# Patient Record
Sex: Male | Born: 1965 | Race: White | Hispanic: No | Marital: Married | Smoking: Current every day smoker
Health system: Southern US, Community
[De-identification: ages and names within clinical notes are randomized; demographics above are authoritative.]

## PROBLEM LIST (undated history)

## (undated) DIAGNOSIS — E7800 Pure hypercholesterolemia, unspecified: Secondary | ICD-10-CM

## (undated) DIAGNOSIS — F419 Anxiety disorder, unspecified: Secondary | ICD-10-CM

## (undated) DIAGNOSIS — I10 Essential (primary) hypertension: Secondary | ICD-10-CM

---

## 2006-05-27 ENCOUNTER — Ambulatory Visit: Payer: Self-pay | Admitting: Internal Medicine

## 2010-08-18 ENCOUNTER — Emergency Department: Payer: Self-pay | Admitting: Emergency Medicine

## 2014-12-16 DIAGNOSIS — E7800 Pure hypercholesterolemia, unspecified: Secondary | ICD-10-CM | POA: Insufficient documentation

## 2014-12-16 DIAGNOSIS — F419 Anxiety disorder, unspecified: Secondary | ICD-10-CM | POA: Insufficient documentation

## 2015-05-01 DIAGNOSIS — I10 Essential (primary) hypertension: Secondary | ICD-10-CM | POA: Insufficient documentation

## 2017-05-23 ENCOUNTER — Emergency Department (HOSPITAL_COMMUNITY)
Admission: EM | Admit: 2017-05-23 | Discharge: 2017-05-23 | Disposition: A | Discharge status: Home / Self Care | Payer: BLUE CROSS/BLUE SHIELD | Attending: Emergency Medicine | Admitting: Emergency Medicine

## 2017-05-23 ENCOUNTER — Emergency Department (HOSPITAL_COMMUNITY): Payer: BLUE CROSS/BLUE SHIELD

## 2017-05-23 ENCOUNTER — Encounter (HOSPITAL_COMMUNITY): Payer: Self-pay

## 2017-05-23 DIAGNOSIS — Z79899 Other long term (current) drug therapy: Secondary | ICD-10-CM | POA: Insufficient documentation

## 2017-05-23 DIAGNOSIS — R109 Unspecified abdominal pain: Secondary | ICD-10-CM

## 2017-05-23 DIAGNOSIS — R1084 Generalized abdominal pain: Secondary | ICD-10-CM | POA: Diagnosis not present

## 2017-05-23 DIAGNOSIS — I10 Essential (primary) hypertension: Secondary | ICD-10-CM | POA: Insufficient documentation

## 2017-05-23 DIAGNOSIS — F17210 Nicotine dependence, cigarettes, uncomplicated: Secondary | ICD-10-CM | POA: Insufficient documentation

## 2017-05-23 HISTORY — DX: Essential (primary) hypertension: I10

## 2017-05-23 HISTORY — DX: Pure hypercholesterolemia, unspecified: E78.00

## 2017-05-23 HISTORY — DX: Anxiety disorder, unspecified: F41.9

## 2017-05-23 LAB — CBC WITH DIFFERENTIAL/PLATELET
Basophils Absolute: 0 10*3/uL (ref 0.0–0.1)
Basophils Relative: 0 %
EOS ABS: 0 10*3/uL (ref 0.0–0.7)
Eosinophils Relative: 0 %
HCT: 43.1 % (ref 39.0–52.0)
HEMOGLOBIN: 14.7 g/dL (ref 13.0–17.0)
LYMPHS ABS: 0.8 10*3/uL (ref 0.7–4.0)
LYMPHS PCT: 7 %
MCH: 31.9 pg (ref 26.0–34.0)
MCHC: 34.1 g/dL (ref 30.0–36.0)
MCV: 93.5 fL (ref 78.0–100.0)
MONOS PCT: 4 %
Monocytes Absolute: 0.4 10*3/uL (ref 0.1–1.0)
NEUTROS PCT: 89 %
Neutro Abs: 10 10*3/uL — ABNORMAL HIGH (ref 1.7–7.7)
Platelets: 215 10*3/uL (ref 150–400)
RBC: 4.61 MIL/uL (ref 4.22–5.81)
RDW: 12.9 % (ref 11.5–15.5)
WBC: 11.3 10*3/uL — AB (ref 4.0–10.5)

## 2017-05-23 LAB — BASIC METABOLIC PANEL
Anion gap: 8 (ref 5–15)
BUN: 8 mg/dL (ref 6–20)
CHLORIDE: 100 mmol/L — AB (ref 101–111)
CO2: 27 mmol/L (ref 22–32)
CREATININE: 1.05 mg/dL (ref 0.61–1.24)
Calcium: 8.8 mg/dL — ABNORMAL LOW (ref 8.9–10.3)
GFR calc Af Amer: >60 mL/min (ref >60)
GFR calc non Af Amer: >60 mL/min (ref >60)
GLUCOSE: 120 mg/dL — AB (ref 65–99)
POTASSIUM: 3.7 mmol/L (ref 3.5–5.1)
SODIUM: 135 mmol/L (ref 135–145)

## 2017-05-23 LAB — LIPASE, BLOOD: Lipase: 30 U/L (ref 11–51)

## 2017-05-23 LAB — HM COLONOSCOPY

## 2017-05-23 MED ORDER — ONDANSETRON HCL 4 MG/2ML IJ SOLN
4.0000 mg | Freq: Once | INTRAMUSCULAR | Status: AC
  Administered 2017-05-23: 4 mg via INTRAVENOUS
  Filled 2017-05-23: qty 2

## 2017-05-23 MED ORDER — AMOXICILLIN-POT CLAVULANATE 875-125 MG PO TABS: 1.0000 | ORAL_TABLET | Freq: Two times a day (BID) | ORAL | 0 refills | Status: DC

## 2017-05-23 MED ORDER — IOPAMIDOL (ISOVUE-300) INJECTION 61%
INTRAVENOUS | Status: AC
  Filled 2017-05-23: qty 30

## 2017-05-23 MED ORDER — AMOXICILLIN-POT CLAVULANATE 875-125 MG PO TABS
1.0000 | ORAL_TABLET | Freq: Once | ORAL | Status: AC
  Administered 2017-05-23: 1 via ORAL
  Filled 2017-05-23: qty 1

## 2017-05-23 MED ORDER — FENTANYL CITRATE (PF) 100 MCG/2ML IJ SOLN
100.0000 ug | Freq: Once | INTRAMUSCULAR | Status: DC
  Filled 2017-05-23: qty 2

## 2017-05-23 MED ORDER — LACTATED RINGERS IV BOLUS (SEPSIS)
1000.0000 mL | Freq: Once | INTRAVENOUS | Status: AC
Start: 2017-05-23 — End: 2017-05-23
  Administered 2017-05-23: 1000 mL via INTRAVENOUS

## 2017-05-23 MED ORDER — IOPAMIDOL (ISOVUE-300) INJECTION 61%
INTRAVENOUS | Status: AC
  Administered 2017-05-23: 100 mL
  Filled 2017-05-23: qty 100

## 2017-05-23 NOTE — ED Provider Notes (Signed)
[facility]-EMERGENCY DEPT Provider Note   CSN: [id number] Arrival date & time: 05/23/17  1546     History   Chief Complaint Chief Complaint  Patient presents with  . Abdominal Pain    HPI Kevin Buckley is a 51 y.o. male.   Abdominal Pain   This is a new problem. The current episode started 1 to 2 hours ago. The problem occurs constantly. The problem has been gradually worsening. The pain is located in the generalized abdominal region. The quality of the pain is sharp. The pain is moderate. Associated symptoms include flatus (improves it). Nothing aggravates the symptoms. Nothing relieves the symptoms. Past workup does not include GI consult, ultrasound or surgery.    Past Medical History:  Diagnosis Date  . Anxiety   . Elevated cholesterol   . Hypertension     There are no active problems to display for this patient.   History reviewed. No pertinent surgical history.     Home Medications    Prior to Admission medications   Medication Sig Start Date End Date Taking? Authorizing Provider  atorvastatin (LIPITOR) 20 MG tablet Take 20 mg by mouth daily. 04/19/17  Yes [provider]  busPIRone (BUSPAR) 10 MG tablet Take 10 mg by mouth daily. 05/16/17  Yes [provider]  fenofibrate (TRICOR) 145 MG tablet Take 145 mg by mouth daily. 05/03/17  Yes [provider]  losartan-hydrochlorothiazide (HYZAAR) 100-12.5 MG tablet Take 1 tablet by mouth daily. 04/19/17  Yes [provider]  sertraline (ZOLOFT) 100 MG tablet Take 150 mg by mouth daily. 04/19/17  Yes [provider]  amoxicillin-clavulanate (AUGMENTIN) 875-125 MG tablet Take 1 tablet by mouth 2 (two) times daily. One po bid x 7 days 05/23/17   Rhydian Baldi, Barbara Cower, MD    Family History History reviewed. No pertinent family history.  Social History Social History  Substance Use Topics  . Smoking status: Current Every Day Smoker  . Smokeless tobacco: Never Used  . Alcohol use Yes      Allergies   Patient has no known allergies.   Review of Systems Review of Systems  Gastrointestinal: Positive for abdominal pain and flatus (improves it).  All other systems reviewed and are negative.    Physical Exam Updated Vital Signs BP (!) 158/110   Pulse 95   Resp 12   Ht 6' (1.829 m)   Wt 108.9 kg (240 lb)   SpO2 100%   BMI 32.55 kg/m   Physical Exam  Constitutional: He is oriented to person, place, and time. He appears well-developed and well-nourished.  HENT:  Head: Normocephalic and atraumatic.  Eyes: Conjunctivae and EOM are normal.  Neck: Normal range of motion.  Cardiovascular: Normal rate.   Pulmonary/Chest: Effort normal. No respiratory distress.  Abdominal: Soft. He exhibits distension. There is tenderness (diffuse, epigastric worse).  Musculoskeletal: Normal range of motion. He exhibits no edema or deformity.  Neurological: He is alert and oriented to person, place, and time. No cranial nerve deficit. Coordination normal.  Skin: Skin is warm and dry.  Nursing note and vitals reviewed.    ED Treatments / Results  Labs (all labs ordered are listed, but only abnormal results are displayed) Labs Reviewed  CBC WITH DIFFERENTIAL/PLATELET - Abnormal; Notable for the following:       Result Value   WBC 11.3 (*)    Neutro Abs 10.0 (*)    All other components within normal limits  BASIC METABOLIC PANEL - Abnormal; Notable for the following:  Chloride 100 (*)    Glucose, Bld 120 (*)    Calcium 8.8 (*)    All other components within normal limits  LIPASE, BLOOD    EKG  EKG Interpretation None       Radiology Ct Abdomen Pelvis W Contrast  Result Date: 05/23/2017 CLINICAL DATA:  Abdominal pain following colonoscopy. EXAM: CT ABDOMEN AND PELVIS WITH CONTRAST TECHNIQUE: Multidetector CT imaging of the abdomen and pelvis was performed using the standard protocol following bolus administration of intravenous contrast. CONTRAST:  100mL  ISOVUE-300 IOPAMIDOL (ISOVUE-300) INJECTION 61% COMPARISON:  None. FINDINGS: Lower chest: No pulmonary nodules. No visible pleural or pericardial effusion. Hepatobiliary: Normal hepatic size and contours without focal liver lesion. No perihepatic ascites. No intra- or extrahepatic biliary dilatation. Normal gallbladder. Pancreas: Normal pancreatic contours and enhancement. No peripancreatic fluid collection or pancreatic ductal dilatation. Spleen: Normal. Adrenals/Urinary Tract: 11 mm right adrenal nodule. Attenuation measure 67 HU. Normal left adrenal gland. No hydronephrosis or solid renal mass. Stomach/Bowel: There is no hiatal hernia. The stomach and duodenum are normal. There is no dilated small bowel or enteric inflammation. No free intraperitoneal air. Metallic density at the distal sigmoid colon is likely secondary to recent polypectomy. There is mild sigmoid colon wall edema. No abdominal fluid collection. Mild sigmoid diverticulosis. The appendix is normal. Vascular/Lymphatic: Normal course and caliber of the major abdominal vessels. No abdominal or pelvic adenopathy. Reproductive: Prostate calcification.  Normal seminal vesicles. Musculoskeletal: Bilateral bridging osteophytes at both sacroiliac joints. No bony spinal canal stenosis. Normal visualized extrathoracic and extraperitoneal soft tissues. Other: No contributory non-categorized findings. IMPRESSION: 1. Mild sigmoid diverticulosis with distal sigmoid wall thickening in the region of the recent cough resection, favored to be postoperative. Diverticulitis, however, would have a similar appearance. 2. No abdominal fluid collection or free intraperitoneal air. Electronically Signed   By: Deatra RobinsonKevin  Herman M.D.   On: 05/23/2017 19:23    Procedures Procedures (including critical care time)  Medications Ordered in ED Medications  ondansetron (ZOFRAN) injection 4 mg (4 mg Intravenous Given 05/23/17 1647)  lactated ringers bolus 1,000 mL (0 mLs  Intravenous Stopped 05/23/17 1917)  iopamidol (ISOVUE-300) 61 % injection (100 mLs  Contrast Given 05/23/17 1900)  amoxicillin-clavulanate (AUGMENTIN) 875-125 MG per tablet 1 tablet (1 tablet Oral Given 05/23/17 2038)     Initial Impression / Assessment and Plan / ED Course  I have reviewed the triage vital signs and the nursing notes.  Pertinent labs & imaging results that were available during my care of the patient were reviewed by me and considered in my medical decision making (see chart for details).     Ct cone to eval for free air or other complication and normal with some colonic thickening. Discussed with patients gastroenterologist who requested starting abx and he would follow up with patient on phone in AM.   Final Clinical Impressions(s) / ED Diagnoses   Final diagnoses:  Abdominal pain, unspecified abdominal location    New Prescriptions Discharge Medication List as of 05/23/2017  9:02 PM    START taking these medications   Details  amoxicillin-clavulanate (AUGMENTIN) 875-125 MG tablet Take 1 tablet by mouth 2 (two) times daily. One po bid x 7 days, Starting Mon 05/23/2017, Print         Lio Wehrly, Barbara CowerJason, MD 05/24/17 1730

## 2017-05-23 NOTE — ED Notes (Signed)
Pt stable, ambulatory, states understanding of discharge instructions 

## 2017-05-23 NOTE — ED Notes (Signed)
Family requesting update of results, Dr. Clayborne DanaMesner notified

## 2017-05-23 NOTE — ED Triage Notes (Signed)
Pt arrived via EMS from surgery center after colonoscopy today.  Pt presents with abdominal  distension, tenderness, fullness, belching and passing gas. Denies N/V

## 2017-05-23 NOTE — ED Notes (Signed)
Pt refusing to change into gown at this time. Pt hypertensive states he has not taking his BP meds today. Family at bedside

## 2018-02-27 IMAGING — CT CT ABD-PELV W/ CM
2 of 5 series · 16 of 46 positions shown, 18 images · IV contrast (APPLIED)
Comparison: None.

CLINICAL DATA: Abdominal pain following colonoscopy.

EXAM:
CT ABDOMEN AND PELVIS WITH CONTRAST
TECHNIQUE: Multidetector CT imaging of the abdomen and pelvis was performed
using the standard protocol following bolus administration of
intravenous contrast.
CONTRAST:  100mL COKOTP-CXX IOPAMIDOL (COKOTP-CXX) INJECTION 61%

[Series 3: abd/ pelvis 5.0 i30f 2 · axial · 0.82mm/px · z∈[+993,+1438]mm · 13 of 101 slices shown, 15 images]
[im 6/101  soft-tissue]
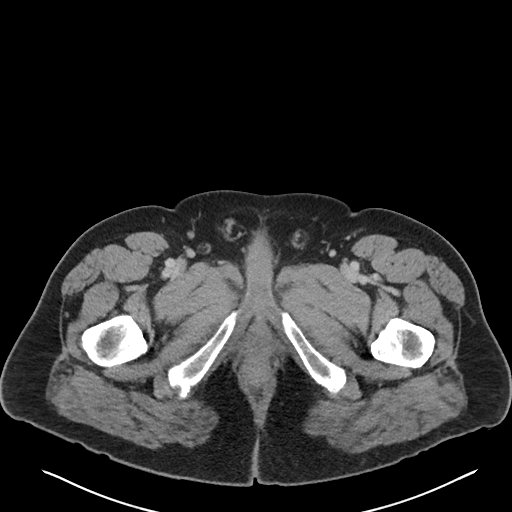
[im 6/101  bone]
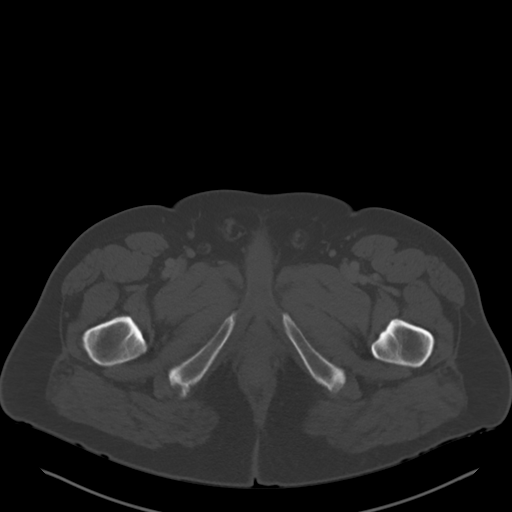
[im 16/101  soft-tissue]
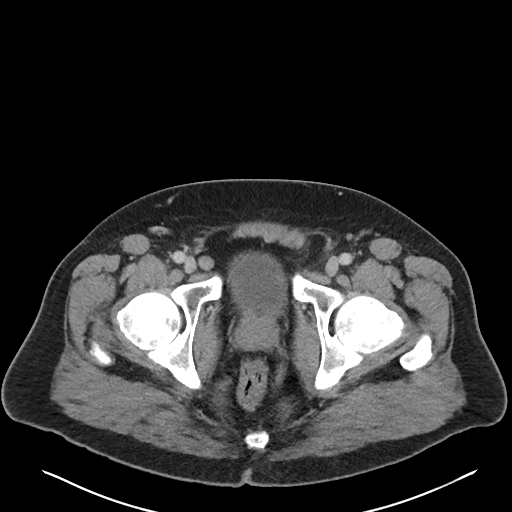
[im 22/101  soft-tissue]
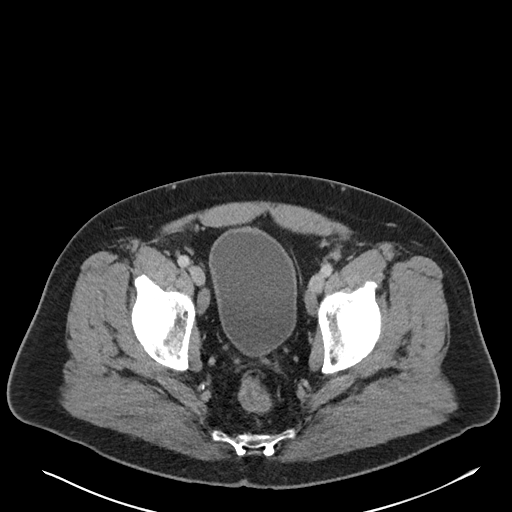
[im 27/101  soft-tissue]
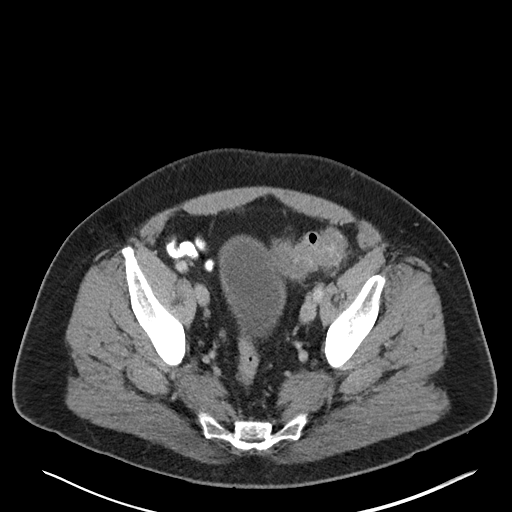
[im 37/101  soft-tissue]
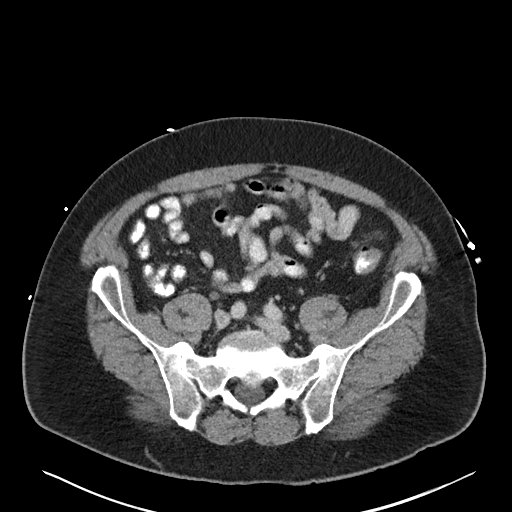
[im 43/101  soft-tissue]
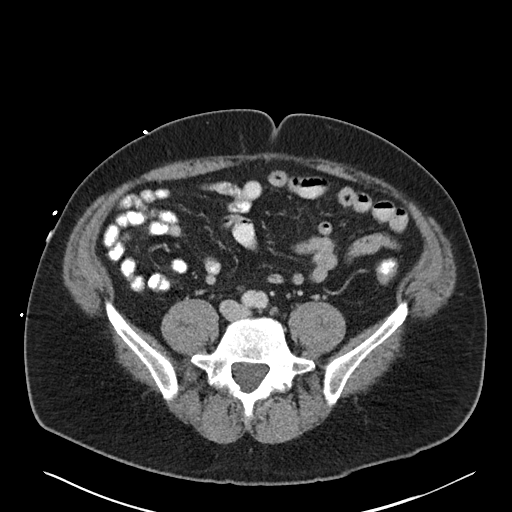
[im 53/101  soft-tissue]
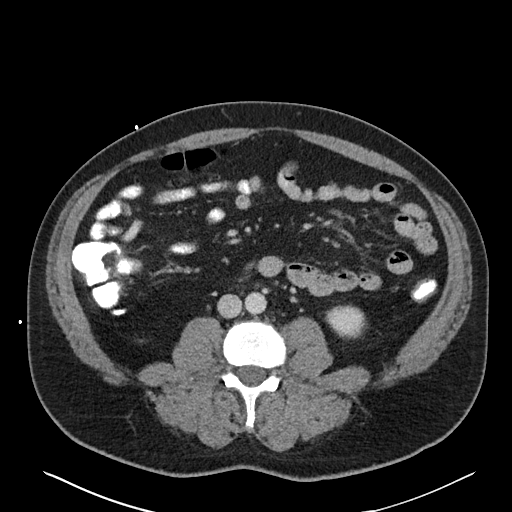
[im 58/101  soft-tissue]
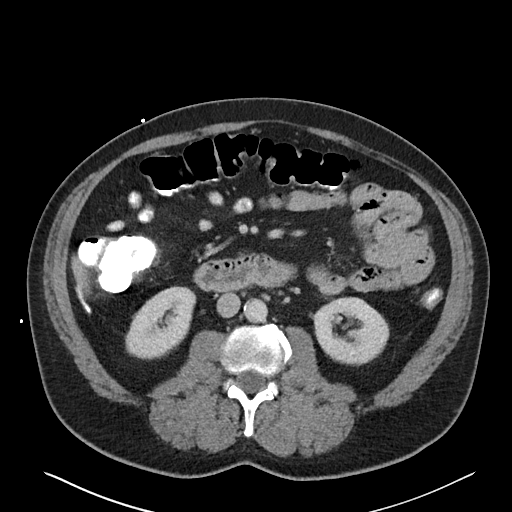
[im 64/101  soft-tissue]
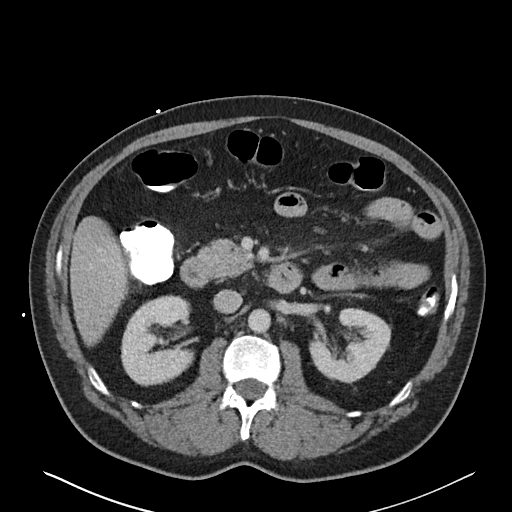
[im 64/101  bone]
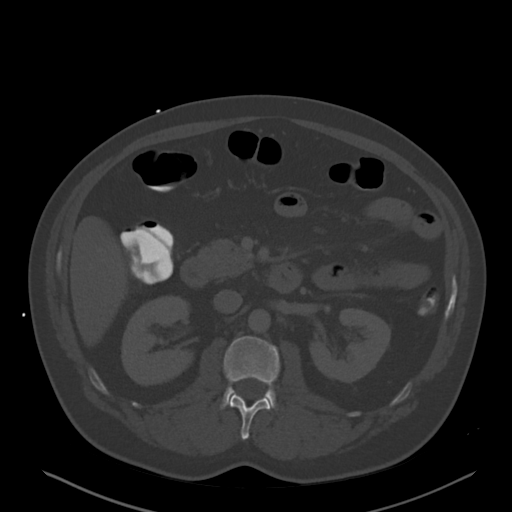
[im 74/101  soft-tissue]
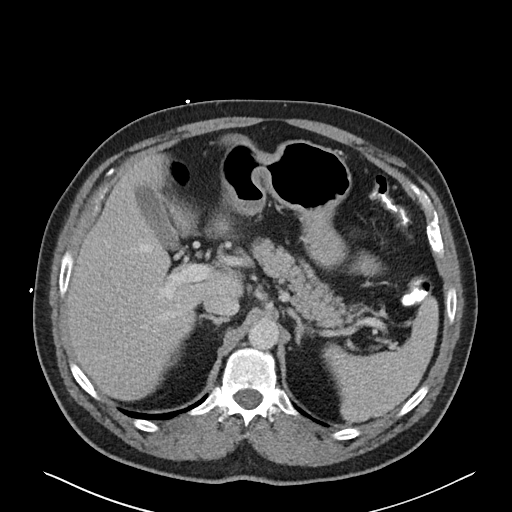
[im 79/101  soft-tissue]
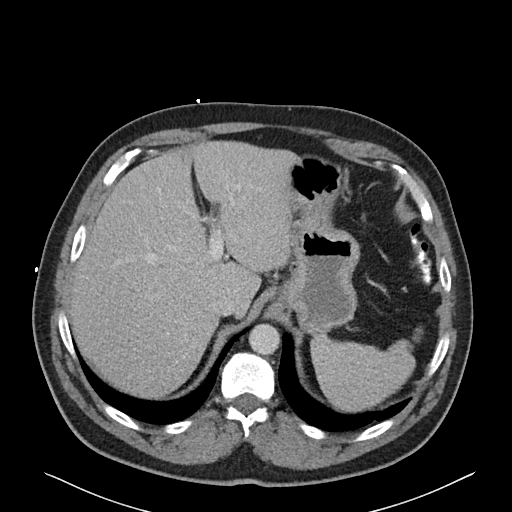
[im 85/101  soft-tissue]
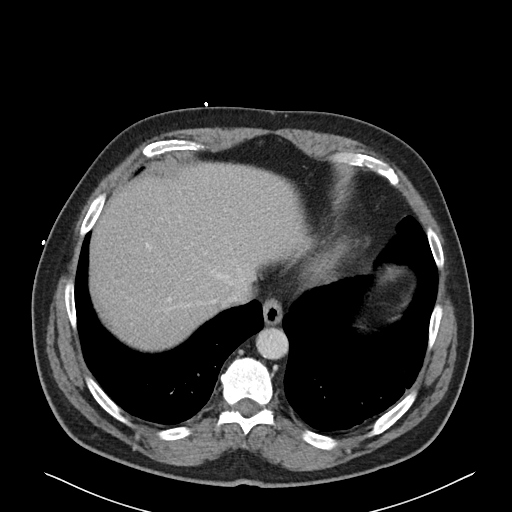
[im 95/101  soft-tissue]
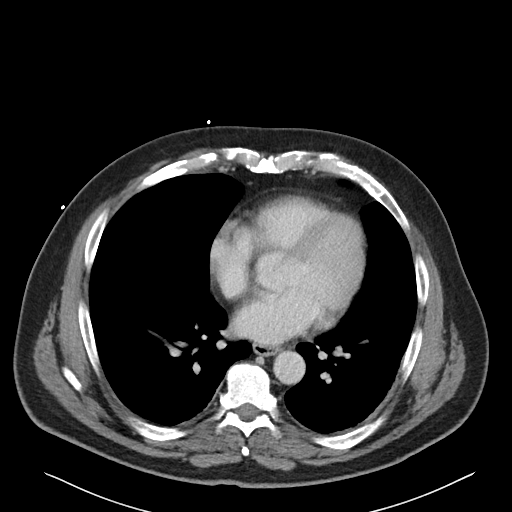

[Series 6: coronal soft tissue · coronal · 0.79mm/px · 3 of 112 slices shown]
[im 38/112  soft-tissue]
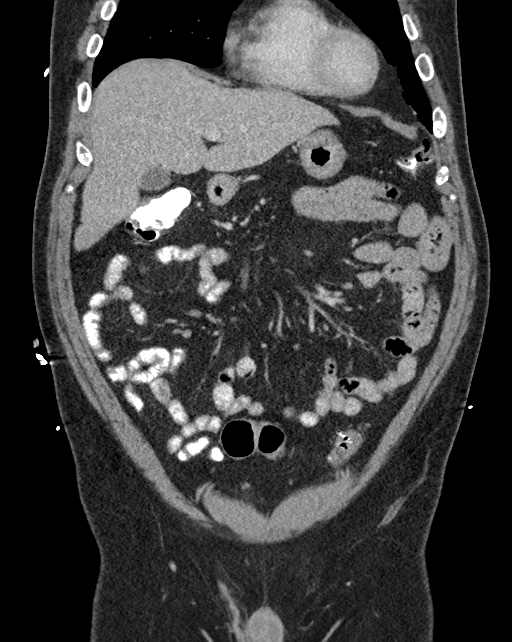
[im 50/112  soft-tissue]
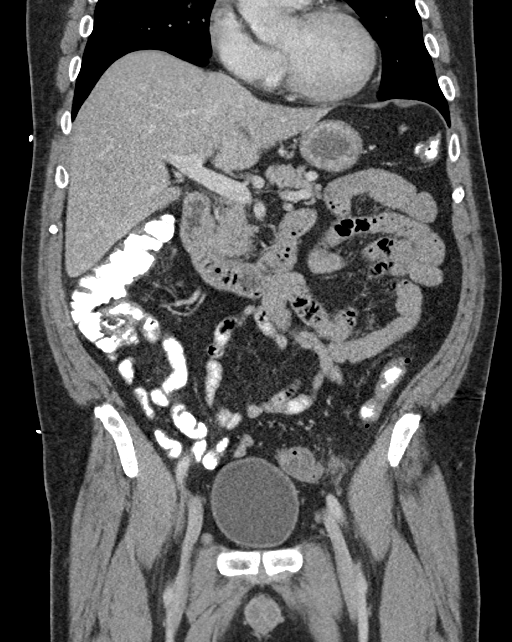
[im 62/112  soft-tissue]
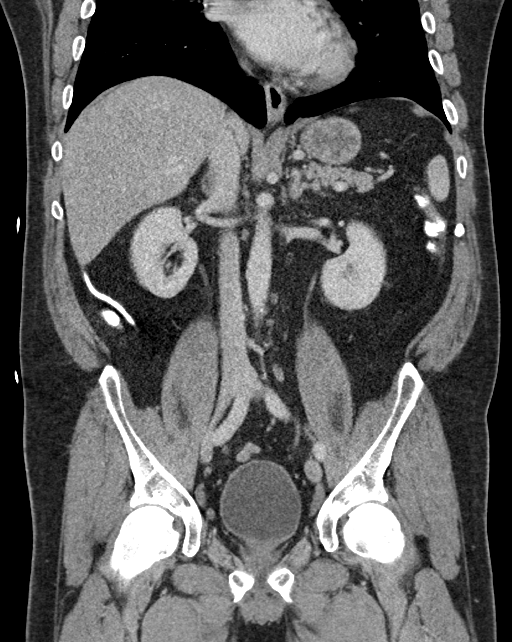

[16 of 46 positions shown; findings below may reference images not displayed]

FINDINGS: Lower chest: No pulmonary nodules. No visible pleural or pericardial
effusion.

Hepatobiliary: Normal hepatic size and contours without focal liver
lesion. No perihepatic ascites. No intra- or extrahepatic biliary
dilatation. Normal gallbladder.

Pancreas: Normal pancreatic contours and enhancement. No
peripancreatic fluid collection or pancreatic ductal dilatation.

Spleen: Normal.

Adrenals/Urinary Tract: 11 mm right adrenal nodule. Attenuation
measure 67 HU. Normal left adrenal gland. No hydronephrosis or solid
renal mass.

Stomach/Bowel: There is no hiatal hernia. The stomach and duodenum
are normal. There is no dilated small bowel or enteric inflammation.
No free intraperitoneal air. Metallic density at the distal sigmoid
colon is likely secondary to recent polypectomy. There is mild
sigmoid colon wall edema. No abdominal fluid collection. Mild
sigmoid diverticulosis. The appendix is normal.

Vascular/Lymphatic: Normal course and caliber of the major abdominal
vessels. No abdominal or pelvic adenopathy.

Reproductive: Prostate calcification.  Normal seminal vesicles.

Musculoskeletal: Bilateral bridging osteophytes at both sacroiliac
joints. No bony spinal canal stenosis. Normal visualized
extrathoracic and extraperitoneal soft tissues.

Other: No contributory non-categorized findings.
IMPRESSION: 1. Mild sigmoid diverticulosis with distal sigmoid wall thickening
in the region of the recent cough resection, favored to be
postoperative. Diverticulitis, however, would have a similar
appearance.
2. No abdominal fluid collection or free intraperitoneal air.

## 2019-08-21 ENCOUNTER — Encounter: Payer: Self-pay | Admitting: Intensive Care

## 2019-08-21 ENCOUNTER — Other Ambulatory Visit: Payer: Self-pay

## 2019-08-21 ENCOUNTER — Emergency Department
Admission: EM | Admit: 2019-08-21 | Discharge: 2019-08-21 | Disposition: A | Discharge status: Home / Self Care | Payer: 59 | Attending: Emergency Medicine | Admitting: Emergency Medicine

## 2019-08-21 DIAGNOSIS — I10 Essential (primary) hypertension: Secondary | ICD-10-CM | POA: Diagnosis not present

## 2019-08-21 DIAGNOSIS — Z79899 Other long term (current) drug therapy: Secondary | ICD-10-CM | POA: Insufficient documentation

## 2019-08-21 DIAGNOSIS — F419 Anxiety disorder, unspecified: Secondary | ICD-10-CM | POA: Insufficient documentation

## 2019-08-21 DIAGNOSIS — F17210 Nicotine dependence, cigarettes, uncomplicated: Secondary | ICD-10-CM | POA: Diagnosis not present

## 2019-08-21 LAB — BASIC METABOLIC PANEL
Anion gap: 11 (ref 5–15)
BUN: 11 mg/dL (ref 6–20)
CO2: 24 mmol/L (ref 22–32)
Calcium: 9.1 mg/dL (ref 8.9–10.3)
Chloride: 103 mmol/L (ref 98–111)
Creatinine, Ser: 1.13 mg/dL (ref 0.61–1.24)
GFR calc Af Amer: >60 mL/min (ref >60)
GFR calc non Af Amer: >60 mL/min (ref >60)
Glucose, Bld: 107 mg/dL — ABNORMAL HIGH (ref 70–99)
Potassium: 3.8 mmol/L (ref 3.5–5.1)
Sodium: 138 mmol/L (ref 135–145)

## 2019-08-21 LAB — CBC
HCT: 44.8 % (ref 39.0–52.0)
Hemoglobin: 15.7 g/dL (ref 13.0–17.0)
MCH: 31.7 pg (ref 26.0–34.0)
MCHC: 35 g/dL (ref 30.0–36.0)
MCV: 90.5 fL (ref 80.0–100.0)
Platelets: 284 10*3/uL (ref 150–400)
RBC: 4.95 MIL/uL (ref 4.22–5.81)
RDW: 12.2 % (ref 11.5–15.5)
WBC: 6.9 10*3/uL (ref 4.0–10.5)
nRBC: 0 % (ref 0.0–0.2)

## 2019-08-21 MED ORDER — LORAZEPAM 0.5 MG PO TABS: 0.5000 mg | ORAL_TABLET | Freq: Every day | ORAL | 0 refills | Status: AC | PRN

## 2019-08-21 MED ORDER — LORAZEPAM 1 MG PO TABS
1.0000 mg | ORAL_TABLET | Freq: Once | ORAL | Status: AC
  Administered 2019-08-21: 09:00:00 1 mg via ORAL
  Filled 2019-08-21: qty 1

## 2019-08-21 NOTE — ED Provider Notes (Addendum)
[facility] Emergency Department Provider Note  Time seen: 9:00 AM  I have reviewed the triage vital signs and the nursing notes.   HISTORY  Chief Complaint Anxiety and Hypertension   HPI Kevin Buckley is a 53 y.o. male with a past medical history of anxiety, hypertension, hyperlipidemia, presents to the emergency department for symptoms of anxiety.  According to the patient for the past 1 year he has not been taking his anxiety medication however over the past 1 week or so he states he has been feeling his anxiety worsened.  This morning he states "I just did not feel right" when asked specifically he says he felt somewhat short of breath he felt very nervous and anxious.  Patient states he took his blood pressure this morning and it was quite elevated which made him even more anxious and prompted his ER visit.  Patient denies any chest pain now or at any point.  Denies any fever cough.  Denies any shortness of breath currently.   Past Medical History:  Diagnosis Date  . Anxiety   . Elevated cholesterol   . Hypertension     There are no active problems to display for this patient.   History reviewed. No pertinent surgical history.  Prior to Admission medications   Medication Sig Start Date End Date Taking? Authorizing Provider  amoxicillin-clavulanate (AUGMENTIN) 875-125 MG tablet Take 1 tablet by mouth 2 (two) times daily. One po bid x 7 days 05/23/17   Mesner, Barbara Cower, MD  atorvastatin (LIPITOR) 20 MG tablet Take 20 mg by mouth daily. 04/19/17   [provider]  busPIRone (BUSPAR) 10 MG tablet Take 10 mg by mouth daily. 05/16/17   [provider]  fenofibrate (TRICOR) 145 MG tablet Take 145 mg by mouth daily. 05/03/17   [provider]  losartan-hydrochlorothiazide (HYZAAR) 100-12.5 MG tablet Take 1 tablet by mouth daily. 04/19/17   [provider]  sertraline (ZOLOFT) 100 MG tablet Take 150 mg by mouth daily. 04/19/17   [provider]    No Known Allergies  History reviewed. No pertinent family history.  Social History Social History   Tobacco Use  . Smoking status: Current Every Day Smoker    Types: Cigarettes  . Smokeless tobacco: Never Used  Substance Use Topics  . Alcohol use: Yes    Alcohol/week: 38.0 standard drinks    Types: 38 Cans of beer per week  . Drug use: No    Review of Systems Constitutional: Negative for fever.  Feels nervous. Cardiovascular: Negative for chest pain. Respiratory: Intermittent shortness of breath Gastrointestinal: Negative for abdominal pain Musculoskeletal: Negative for musculoskeletal complaints Neurological: Negative for headache All other ROS negative  ____________________________________________   PHYSICAL EXAM:  VITAL SIGNS: ED Triage Vitals  Enc Vitals Group     BP 08/21/19 0848 (!) 169/110     Pulse Rate 08/21/19 0848 88     Resp 08/21/19 0848 18     Temp 08/21/19 0846 98.6 F (37 C)     Temp Source 08/21/19 0846 Oral     SpO2 08/21/19 0848 94 %     Weight 08/21/19 0849 240 lb (108.9 kg)     Height 08/21/19 0849 6' (1.829 m)     Head Circumference --      Peak Flow --      Pain Score 08/21/19 0848 0     Pain Loc --      Pain Edu? --  Excl. in [place]? --    Constitutional: Alert and oriented.  Overall well-appearing but somewhat anxious. Eyes: Normal exam ENT      Head: Normocephalic and atraumatic.      Mouth/Throat: Mucous membranes are moist. Cardiovascular: Normal rate, regular rhythm. No murmur Respiratory: Normal respiratory effort without tachypnea nor retractions. Breath sounds are clear Gastrointestinal: Soft and nontender. No distention.  Musculoskeletal: Nontender with normal range of motion in all extremities. Neurologic:  Normal speech and language. No gross focal neurologic deficits  Skin:  Skin is warm, dry and intact.  Psychiatric: Mood and affect are normal.    ____________________________________________  INITIAL IMPRESSION / ASSESSMENT AND PLAN / ED COURSE  Pertinent labs & imaging results that were available during my care of the patient were reviewed by me and considered in my medical decision making (see chart for details).   Patient presents to the emergency department with symptoms of feeling nervous shortness of breath.  Patient states this feels typical of his anxiety.  Denies any cough or fever.  Denies any chest pain at any point.  Overall the patient appears well he does appear anxious on exam.  Blood pressure currently 170/120.  We will check an EKG, basic labs and dose 1 mg of Ativan for the patient.  Patient agreeable to plan of care.  We will continue to closely monitor for symptom improvement.  Labs are largely within normal limits.  EKG is reassuring.  Patient is feeling much better after Ativan.  We will discharge the patient home with PCP follow-up.  Patient agreeable to plan of care.  Kevin Buckley was evaluated in Emergency Department on 08/21/2019 for the symptoms described in the history of present illness. He was evaluated in the context of the global COVID-19 pandemic, which necessitated consideration that the patient might be at risk for infection with the SARS-CoV-2 virus that causes COVID-19. Institutional protocols and algorithms that pertain to the evaluation of patients at risk for COVID-19 are in a state of rapid change based on information released by regulatory bodies including the CDC and federal and state organizations. These policies and algorithms were followed during the patient's care in the ED.  EKG viewed and interpreted by myself shows a normal sinus rhythm at 83 bpm with a narrow QRS, normal axis, normal intervals, no concerning ST changes.  ____________________________________________   FINAL CLINICAL IMPRESSION(S) / ED DIAGNOSES  Hypertension Anxiety   Harvest Dark, MD 08/21/19 [id number]     Harvest Dark, MD 08/27/19 [phone]

## 2019-08-21 NOTE — ED Triage Notes (Signed)
Patient c/o HTN this AM along with anxiety. Reports taking medicine in past for anxiety.

## 2021-07-20 ENCOUNTER — Telehealth: Payer: Self-pay

## 2021-07-20 NOTE — Telephone Encounter (Signed)
Copied from CRM [id number]. Topic: Appointment Scheduling - Scheduling Inquiry for Clinic >> Jul 20, 2021 11:49 AM Fanny Bien wrote: Reason for CRM: Pt would like to know if [facility] would accept him as a new patient/ [facility] see mother in law jean stuart. Please advise   Please call and schedule NP appointment with [facility].

## 2021-07-21 NOTE — Telephone Encounter (Signed)
Pt is scheduled for a new pt appointment on 09/08/2021

## 2021-08-28 ENCOUNTER — Encounter: Payer: Self-pay | Admitting: Nurse Practitioner

## 2021-08-28 DIAGNOSIS — Z800 Family history of malignant neoplasm of digestive organs: Secondary | ICD-10-CM | POA: Insufficient documentation

## 2021-08-28 DIAGNOSIS — F17210 Nicotine dependence, cigarettes, uncomplicated: Secondary | ICD-10-CM | POA: Insufficient documentation

## 2021-08-28 DIAGNOSIS — E669 Obesity, unspecified: Secondary | ICD-10-CM | POA: Insufficient documentation

## 2021-08-28 DIAGNOSIS — R7301 Impaired fasting glucose: Secondary | ICD-10-CM | POA: Insufficient documentation

## 2021-09-08 ENCOUNTER — Encounter: Payer: Self-pay | Admitting: Nurse Practitioner

## 2021-09-08 ENCOUNTER — Ambulatory Visit (INDEPENDENT_AMBULATORY_CARE_PROVIDER_SITE_OTHER): Payer: 59 | Admitting: Nurse Practitioner

## 2021-09-08 ENCOUNTER — Other Ambulatory Visit: Payer: Self-pay

## 2021-09-08 VITALS — BP 148/84 | HR 89 | Temp 98.6°F | Ht 68.0 in | Wt 224.0 lb

## 2021-09-08 DIAGNOSIS — Z789 Other specified health status: Secondary | ICD-10-CM

## 2021-09-08 DIAGNOSIS — Z7689 Persons encountering health services in other specified circumstances: Secondary | ICD-10-CM

## 2021-09-08 DIAGNOSIS — Z114 Encounter for screening for human immunodeficiency virus [HIV]: Secondary | ICD-10-CM

## 2021-09-08 DIAGNOSIS — F419 Anxiety disorder, unspecified: Secondary | ICD-10-CM

## 2021-09-08 DIAGNOSIS — E7800 Pure hypercholesterolemia, unspecified: Secondary | ICD-10-CM

## 2021-09-08 DIAGNOSIS — Z1159 Encounter for screening for other viral diseases: Secondary | ICD-10-CM

## 2021-09-08 DIAGNOSIS — R7301 Impaired fasting glucose: Secondary | ICD-10-CM

## 2021-09-08 DIAGNOSIS — I10 Essential (primary) hypertension: Secondary | ICD-10-CM

## 2021-09-08 DIAGNOSIS — Z6834 Body mass index (BMI) 34.0-34.9, adult: Secondary | ICD-10-CM

## 2021-09-08 DIAGNOSIS — E6609 Other obesity due to excess calories: Secondary | ICD-10-CM

## 2021-09-08 DIAGNOSIS — Z800 Family history of malignant neoplasm of digestive organs: Secondary | ICD-10-CM

## 2021-09-08 DIAGNOSIS — F17210 Nicotine dependence, cigarettes, uncomplicated: Secondary | ICD-10-CM

## 2021-09-08 MED ORDER — FENOFIBRATE 145 MG PO TABS: 145.0000 mg | ORAL_TABLET | Freq: Every day | ORAL | 4 refills | Status: DC

## 2021-09-08 MED ORDER — BUSPIRONE HCL 10 MG PO TABS: 10.0000 mg | ORAL_TABLET | Freq: Every day | ORAL | 4 refills | Status: DC

## 2021-09-08 MED ORDER — LOSARTAN POTASSIUM-HCTZ 100-12.5 MG PO TABS: 1.0000 | ORAL_TABLET | Freq: Every day | ORAL | 4 refills | Status: DC

## 2021-09-08 MED ORDER — ATORVASTATIN CALCIUM 20 MG PO TABS: 20.0000 mg | ORAL_TABLET | Freq: Every day | ORAL | 4 refills | Status: DC

## 2021-09-08 MED ORDER — SERTRALINE HCL 100 MG PO TABS: 150.0000 mg | ORAL_TABLET | Freq: Every day | ORAL | 4 refills | Status: DC

## 2021-09-08 NOTE — Assessment & Plan Note (Signed)
I have recommended complete cessation of tobacco use. I have discussed various options available for assistance with tobacco cessation including over the counter methods (Nicotine gum, patch and lozenges). We also discussed prescription options (Chantix, Nicotine Inhaler / Nasal Spray). The patient is not interested in pursuing any prescription tobacco cessation options at this time.  Discussed lung cancer screening and recommend he obtain this.

## 2021-09-08 NOTE — Assessment & Plan Note (Signed)
A couple beers a night with AUDIT 11 -- recommend he cut back on alcohol use which would benefit blood pressure and overall health.  CMP today.

## 2021-09-08 NOTE — Assessment & Plan Note (Signed)
Chronic, ongoing.  Continue current medication regimen and adjust as needed.  CMP and lipid panel today. 

## 2021-09-08 NOTE — Assessment & Plan Note (Signed)
Chronic, ongoing.  Denies SI/HI.  At this time continue current medication regimen and adjust as needed.  Refills sent in.

## 2021-09-08 NOTE — Assessment & Plan Note (Signed)
BMI 34.06.  Recommended eating smaller high protein, low fat meals more frequently and exercising 30 mins a day 5 times a week with a goal of 10-15lb weight loss in the next 3 months. Patient voiced their understanding and motivation to adhere to these recommendations.

## 2021-09-08 NOTE — Progress Notes (Signed)
New Patient Office Visit  Subjective:  Patient ID: Kevin Buckley, male    DOB: 1966/09/18  Age: 55 y.o. MRN: [id number]  CC:  Chief Complaint  Patient presents with   Establish Care    New patient. Get medications refilled.Roger coat syndrome    HPI Kevin Buckley presents for new patient visit to establish care.  Introduced to Publishing rights manager role and practice setting.  All questions answered.  Discussed provider/patient relationship and expectations.  Was previously followed at [facility], Dr. Larwance Sachs.   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  HYPERTENSION Continues on Losartan-HCTZ 100-12.5 MG.  Has Plouff coat syndrome with provider visits.  Does drink alcohol, a couple beers a night.  Is a smoker -- smokes 1 1/2 PPD, has been smoking since mid-20's.   Hypertension status: stable  Satisfied with current treatment? yes Duration of hypertension: chronic BP monitoring frequency:  not checking BP range:  BP medication side effects:  no Medication compliance: good compliance Previous BP meds: as above Aspirin: no Recurrent headaches: no Visual changes: no Palpitations: no Dyspnea: no Chest pain: no Lower extremity edema: no Dizzy/lightheaded: no  Flowsheet Row Office Visit from 09/08/2021 in [facility]  Alcohol Use Disorder Identification Test Final Score (AUDIT) 11        HYPERLIPIDEMIA Currently taking Atorvastatin and Fenofibrate, has been taking 4-5 years.  Last LDL 86.  Has been out of medication for a little bit and needs refills. Hyperlipidemia status: good compliance Satisfied with current treatment?  yes Side effects:  no Medication compliance: good compliance Past cholesterol meds: as above Supplements: none Aspirin:  no The ASCVD Risk score (Arnett DK, et al., 2019) failed to calculate for the following reasons:   Cannot find a previous HDL lab   Cannot find a previous total cholesterol lab Chest  pain:  no Coronary artery disease:  no Family history CAD:  no Family history early CAD:  no   ANXIETY/STRESS Taking Sertraline and Buspar. Duration:stable Anxious mood: yes , especially with office visits Excessive worrying: no Irritability: no  Sweating: no Nausea: no Palpitations:no Hyperventilation: no Panic attacks: no -- not in a long time Agoraphobia: no  Obscessions/compulsions: no Depressed mood: no Depression screen PHQ 2/9 09/08/2021  Decreased Interest 0  Down, Depressed, Hopeless 0  PHQ - 2 Score 0  Altered sleeping 0  Tired, decreased energy 0  Change in appetite 0  Feeling bad or failure about yourself  0  Trouble concentrating 0  Moving slowly or fidgety/restless 0  Suicidal thoughts 0  PHQ-9 Score 0  Difficult doing work/chores Not difficult at all   Anhedonia: no Weight changes: no Insomnia: none Hypersomnia: no Fatigue/loss of energy: no Feelings of worthlessness: no Feelings of guilt: no Impaired concentration/indecisiveness: no Suicidal ideations: no  Crying spells: no Recent Stressors/Life Changes: no   Relationship problems: no   Family stress: no     Financial stress: no    Job stress: no    Recent death/loss: no   Past Medical History:  Diagnosis Date   Anxiety    Elevated cholesterol    Hypertension     History reviewed. No pertinent surgical history.  Family History  Problem Relation Age of Onset   Multiple sclerosis Mother    Pancreatic cancer Father    Hypertension Paternal Grandmother     Social History   Socioeconomic History   Marital status: Married    Spouse name: Not  on file   Number of children: 1   Years of education: Not on file   Highest education level: Not on file  Occupational History   Not on file  Tobacco Use   Smoking status: Every Day    Types: Cigarettes   Smokeless tobacco: Never  Substance and Sexual Activity   Alcohol use: Yes    Alcohol/week: 38.0 standard drinks    Types: 38 Cans of  beer per week   Drug use: No   Sexual activity: Yes  Other Topics Concern   Not on file  Social History Narrative   Not on file   Social Determinants of Health   Financial Resource Strain: Low Risk    Difficulty of Paying Living Expenses: Not hard at all  Food Insecurity: No Food Insecurity   Worried About Programme researcher, broadcasting/film/video in the Last Year: Never true   Ran Out of Food in the Last Year: Never true  Transportation Needs: No Transportation Needs   Lack of Transportation (Medical): No   Lack of Transportation (Non-Medical): No  Physical Activity: Insufficiently Active   Days of Exercise per Week: 3 days   Minutes of Exercise per Session: 30 min  Stress: No Stress Concern Present   Feeling of Stress : Not at all  Social Connections: Moderately Isolated   Frequency of Communication with Friends and Family: Three times a week   Frequency of Social Gatherings with Friends and Family: Three times a week   Attends Religious Services: Never   Active Member of Clubs or Organizations: No   Attends Banker Meetings: Never   Marital Status: Married  Catering manager Violence: Not At Risk   Fear of Current or Ex-Partner: No   Emotionally Abused: No   Physically Abused: No   Sexually Abused: No    ROS Review of Systems  Constitutional:  Negative for activity change, diaphoresis, fatigue and fever.  Respiratory:  Negative for cough, chest tightness, shortness of breath and wheezing.   Cardiovascular:  Negative for chest pain, palpitations and leg swelling.  Gastrointestinal: Negative.   Neurological: Negative.   Psychiatric/Behavioral: Negative.     Objective:   Today's Vitals: BP (!) 148/84 (BP Location: Left Arm, Patient Position: Sitting)   Pulse 89   Temp 98.6 F (37 C) (Oral)   Ht 5\' 8"  (1.727 m)   Wt 224 lb (101.6 kg)   SpO2 97%   BMI 34.06 kg/m   Physical Exam Vitals and nursing note reviewed.  Constitutional:      General: He is awake. He is not in  acute distress.    Appearance: He is well-developed and well-groomed. He is obese. He is not ill-appearing or toxic-appearing.  HENT:     Head: Normocephalic and atraumatic.     Right Ear: Hearing normal. No drainage.     Left Ear: Hearing normal. No drainage.  Eyes:     General: Lids are normal.        Right eye: No discharge.        Left eye: No discharge.     Conjunctiva/sclera: Conjunctivae normal.     Pupils: Pupils are equal, round, and reactive to light.  Neck:     Thyroid: No thyromegaly.     Vascular: No carotid bruit.  Cardiovascular:     Rate and Rhythm: Normal rate and regular rhythm.     Heart sounds: Normal heart sounds, S1 normal and S2 normal. No murmur heard.   No gallop.  Pulmonary:     Effort: Pulmonary effort is normal. No accessory muscle usage or respiratory distress.     Breath sounds: Normal breath sounds.  Abdominal:     General: Bowel sounds are normal.     Palpations: Abdomen is soft. There is no hepatomegaly or splenomegaly.  Musculoskeletal:        General: Normal range of motion.     Cervical back: Normal range of motion and neck supple.     Right lower leg: No edema.     Left lower leg: No edema.  Skin:    General: Skin is warm and dry.     Capillary Refill: Capillary refill takes less than 2 seconds.     Findings: No rash.  Neurological:     Mental Status: He is alert and oriented to person, place, and time.     Deep Tendon Reflexes: Reflexes are normal and symmetric.     Reflex Scores:      Brachioradialis reflexes are 2+ on the right side and 2+ on the left side.      Patellar reflexes are 2+ on the right side and 2+ on the left side. Psychiatric:        Attention and Perception: Attention normal.        Mood and Affect: Mood normal.        Speech: Speech normal.        Behavior: Behavior normal. Behavior is cooperative.        Thought Content: Thought content normal.    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  Recommend he monitor BP at least a few mornings a week at home, 2 hours after taking medication, and document.  Discussed with him the benefit of knowing home BP levels for medication changes.  DASH diet at home.  Continue current medication regimen and adjust as needed.  Labs today: CMP.  Return in 8 weeks for physical.  Refills sent in.       Relevant Medications   atorvastatin (LIPITOR) 20 MG tablet   fenofibrate (TRICOR) 145 MG tablet   losartan-hydrochlorothiazide (HYZAAR) 100-12.5 MG tablet   Other Relevant Orders   Comprehensive metabolic panel   TSH     Endocrine   IFG (impaired fasting glucose)    Noted on past labs, check CMP today and if elevation plan on A1c check at physical in 8 weeks.        Other   Anxiety    Chronic, ongoing.  Denies SI/HI.  At this time continue current medication regimen and adjust as needed.  Refills sent in.      Relevant Medications   busPIRone (BUSPAR) 10 MG tablet   sertraline (ZOLOFT) 100 MG tablet   Pure hypercholesterolemia    Chronic, ongoing.  Continue current medication regimen and adjust as needed.  CMP and lipid panel today.         Relevant Medications   atorvastatin (LIPITOR) 20 MG tablet   fenofibrate (TRICOR) 145 MG tablet   losartan-hydrochlorothiazide (HYZAAR) 100-12.5 MG tablet   Other Relevant Orders   Lipid Panel w/o Chol/HDL Ratio   Obesity    BMI 34.06.  Recommended eating smaller high protein, low fat meals more frequently and exercising 30 mins a day 5 times a week with  a goal of 10-15lb weight loss in the next 3 months. Patient voiced their understanding and motivation to adhere to these recommendations.       Nicotine dependence, cigarettes, uncomplicated    I have recommended complete cessation of tobacco use. I have discussed  various options available for assistance with tobacco cessation including over the counter methods (Nicotine gum, patch and lozenges). We also discussed prescription options (Chantix, Nicotine Inhaler / Nasal Spray). The patient is not interested in pursuing any prescription tobacco cessation options at this time.  Discussed lung cancer screening and recommend he obtain this.       Family history of pancreatic cancer    In father, patient is due to colonoscopy (G8ZM) and discussed with him -- he will discuss with his wife and then alert PCP if referral needed.      Alcohol use    A couple beers a night with AUDIT 11 -- recommend he cut back on alcohol use which would benefit blood pressure and overall health.  CMP today.      Other Visit Diagnoses     Encounter to establish care    -  Primary   Need for hepatitis C screening test       Hep C screening on labs today, discussed with patient.   Relevant Orders   Hepatitis C antibody   Encounter for screening for HIV       HIV screening on labs today, discussed with patient.   Relevant Orders   HIV Antibody (routine testing w rflx)       Outpatient Encounter Medications as of 09/08/2021  Medication Sig   [DISCONTINUED] atorvastatin (LIPITOR) 20 MG tablet Take 20 mg by mouth daily.   [DISCONTINUED] busPIRone (BUSPAR) 10 MG tablet Take 10 mg by mouth daily.   [DISCONTINUED] fenofibrate (TRICOR) 145 MG tablet Take 145 mg by mouth daily.   [DISCONTINUED] losartan-hydrochlorothiazide (HYZAAR) 100-12.5 MG tablet Take 1 tablet by mouth daily.   [DISCONTINUED] sertraline (ZOLOFT) 100 MG tablet Take 150 mg by mouth daily.   atorvastatin (LIPITOR) 20 MG tablet Take 1 tablet (20 mg total) by mouth daily.   busPIRone (BUSPAR) 10 MG tablet Take 1 tablet (10 mg total) by mouth daily.   fenofibrate (TRICOR) 145 MG tablet Take 1 tablet (145 mg total) by mouth daily.   losartan-hydrochlorothiazide (HYZAAR) 100-12.5 MG tablet Take 1 tablet by mouth  daily.   sertraline (ZOLOFT) 100 MG tablet Take 1.5 tablets (150 mg total) by mouth daily.   [DISCONTINUED] amoxicillin-clavulanate (AUGMENTIN) 875-125 MG tablet Take 1 tablet by mouth 2 (two) times daily. One po bid x 7 days   No facility-administered encounter medications on file as of 09/08/2021.    Follow-up: Return in about 8 weeks (around 11/03/2021) for Annual physical.   Marjie Skiff, NP

## 2021-09-08 NOTE — Assessment & Plan Note (Signed)
Noted on past labs, check CMP today and if elevation plan on A1c check at physical in 8 weeks.

## 2021-09-08 NOTE — Assessment & Plan Note (Signed)
Chronic, ongoing with elevation in office, improving with recheck -- does endorse Mcclain coat syndrome.  Recommend he monitor BP at least a few mornings a week at home, 2 hours after taking medication, and document.  Discussed with him the benefit of knowing home BP levels for medication changes.  DASH diet at home.  Continue current medication regimen and adjust as needed.  Labs today: CMP.  Return in 8 weeks for physical.  Refills sent in.

## 2021-09-08 NOTE — Assessment & Plan Note (Signed)
In father, patient is due to colonoscopy (F5OH) and discussed with him -- he will discuss with his wife and then alert PCP if referral needed.

## 2021-09-08 NOTE — Patient Instructions (Signed)

## 2021-09-09 LAB — COMPREHENSIVE METABOLIC PANEL
ALT: 17 IU/L (ref 0–44)
AST: 19 IU/L (ref 0–40)
Albumin/Globulin Ratio: 2.2 (ref 1.2–2.2)
Albumin: 4.7 g/dL (ref 3.8–4.9)
Alkaline Phosphatase: 75 IU/L (ref 44–121)
BUN/Creatinine Ratio: 11 (ref 9–20)
BUN: 13 mg/dL (ref 6–24)
Bilirubin Total: 0.6 mg/dL (ref 0.0–1.2)
CO2: 22 mmol/L (ref 20–29)
Calcium: 9.3 mg/dL (ref 8.7–10.2)
Chloride: 99 mmol/L (ref 96–106)
Creatinine, Ser: 1.16 mg/dL (ref 0.76–1.27)
Globulin, Total: 2.1 g/dL (ref 1.5–4.5)
Glucose: 95 mg/dL (ref 70–99)
Potassium: 4.1 mmol/L (ref 3.5–5.2)
Sodium: 138 mmol/L (ref 134–144)
Total Protein: 6.8 g/dL (ref 6.0–8.5)
eGFR: 74 mL/min/{1.73_m2} (ref >59)

## 2021-09-09 LAB — LIPID PANEL W/O CHOL/HDL RATIO
Cholesterol, Total: 169 mg/dL (ref 100–199)
HDL: 51 mg/dL (ref >39)
LDL Chol Calc (NIH): 100 mg/dL — ABNORMAL HIGH (ref 0–99)
Triglycerides: 100 mg/dL (ref 0–149)
VLDL Cholesterol Cal: 18 mg/dL (ref 5–40)

## 2021-09-09 LAB — HIV ANTIBODY (ROUTINE TESTING W REFLEX): HIV Screen 4th Generation wRfx: NONREACTIVE

## 2021-09-09 LAB — TSH: TSH: 2.56 u[IU]/mL (ref 0.450–4.500)

## 2021-09-09 LAB — HEPATITIS C ANTIBODY: Hep C Virus Ab: <0.1 s/co ratio (ref 0.0–0.9)

## 2021-09-09 NOTE — Progress Notes (Signed)
Contacted via MyChart   Good morning Kevin Buckley, your labs have returned and overall look good with exception of LDL which is above goal.  LDL is the bad cholesterol and with your Atorvastatin we would like this lower, goal <70 for stroke prevention.  I recommend restart your cholesterol medication and we will recheck this next visit, if still above goal then we will increase Atorvastatin dosing.  Any questions? Keep being awesome!!  Thank you for allowing me to participate in your care.  I appreciate you. Kindest regards, Dezarai Prew

## 2021-11-11 ENCOUNTER — Encounter: Payer: 59 | Admitting: Nurse Practitioner

## 2021-11-11 DIAGNOSIS — E7800 Pure hypercholesterolemia, unspecified: Secondary | ICD-10-CM

## 2021-11-11 DIAGNOSIS — N400 Enlarged prostate without lower urinary tract symptoms: Secondary | ICD-10-CM

## 2021-11-11 DIAGNOSIS — R7301 Impaired fasting glucose: Secondary | ICD-10-CM

## 2021-11-11 DIAGNOSIS — I10 Essential (primary) hypertension: Secondary | ICD-10-CM

## 2021-11-11 DIAGNOSIS — Z0000 Encounter for general adult medical examination without abnormal findings: Secondary | ICD-10-CM

## 2021-11-11 DIAGNOSIS — Z800 Family history of malignant neoplasm of digestive organs: Secondary | ICD-10-CM

## 2021-12-09 ENCOUNTER — Other Ambulatory Visit: Payer: Self-pay | Admitting: Nurse Practitioner

## 2021-12-09 NOTE — Telephone Encounter (Signed)
Requested medication (s) are due for refill today:   Yes  Requested medication (s) are on the active medication list:   Yes  Future visit scheduled:   Yes   Last ordered: 09/08/2021 #90, 4 refills  Returned because pharmacy requesting an alternative.   Requested Prescriptions  Pending Prescriptions Disp Refills   fenofibrate micronized (LOFIBRA) 134 MG capsule [Pharmacy Med Name: FENOFIBRATE 134 MG CAPSULE]  0     Cardiovascular:  Antilipid - Fibric Acid Derivatives Failed - 12/09/2021  7:28 AM      Failed - HGB in normal range and within 360 days    Hemoglobin  Date Value Ref Range Status  08/21/2019 15.7 13.0 - 17.0 g/dL Final          Failed - HCT in normal range and within 360 days    HCT  Date Value Ref Range Status  08/21/2019 44.8 39.0 - 52.0 % Final          Failed - PLT in normal range and within 360 days    Platelets  Date Value Ref Range Status  08/21/2019 284 150 - 400 K/uL Final          Failed - WBC in normal range and within 360 days    WBC  Date Value Ref Range Status  08/21/2019 6.9 4.0 - 10.5 K/uL Final          Failed - Lipid Panel in normal range within the last 12 months    Cholesterol, Total  Date Value Ref Range Status  09/08/2021 169 100 - 199 mg/dL Final   LDL Chol Calc (NIH)  Date Value Ref Range Status  09/08/2021 100 (H) 0 - 99 mg/dL Final   HDL  Date Value Ref Range Status  09/08/2021 51 >39 mg/dL Final   Triglycerides  Date Value Ref Range Status  09/08/2021 100 0 - 149 mg/dL Final         Passed - ALT in normal range and within 360 days    ALT  Date Value Ref Range Status  09/08/2021 17 0 - 44 IU/L Final          Passed - AST in normal range and within 360 days    AST  Date Value Ref Range Status  09/08/2021 19 0 - 40 IU/L Final          Passed - Cr in normal range and within 360 days    Creatinine, Ser  Date Value Ref Range Status  09/08/2021 1.16 0.76 - 1.27 mg/dL Final          Passed - eGFR is 30 or  above and within 360 days    GFR calc Af Amer  Date Value Ref Range Status  08/21/2019 >60 >60 mL/min Final   GFR calc non Af Amer  Date Value Ref Range Status  08/21/2019 >60 >60 mL/min Final   eGFR  Date Value Ref Range Status  09/08/2021 74 >59 mL/min/1.73 Final          Passed - Valid encounter within last 12 months    Recent Outpatient Visits           3 months ago Encounter to establish care   [facility] Venita Lick, NP       Future Appointments             In 2 days Venita Lick, NP [facility]

## 2021-12-10 ENCOUNTER — Telehealth: Payer: Self-pay | Admitting: Nurse Practitioner

## 2021-12-10 NOTE — Telephone Encounter (Signed)
Called patient to confirm appointment for tomorrow, 2/3 and to advise that we are unable to verify his insurance on file.  We need a physical copy of his insurance card for his visit.  Should patient call back, please advise.

## 2021-12-11 ENCOUNTER — Encounter: Payer: Self-pay | Admitting: Nurse Practitioner

## 2021-12-11 ENCOUNTER — Ambulatory Visit (INDEPENDENT_AMBULATORY_CARE_PROVIDER_SITE_OTHER): Payer: BC Managed Care – PPO | Admitting: Nurse Practitioner

## 2021-12-11 ENCOUNTER — Other Ambulatory Visit: Payer: Self-pay

## 2021-12-11 VITALS — BP 132/84 | HR 86 | Temp 97.9°F | Resp 20 | Ht 69.0 in | Wt 218.4 lb

## 2021-12-11 DIAGNOSIS — R7301 Impaired fasting glucose: Secondary | ICD-10-CM

## 2021-12-11 DIAGNOSIS — N400 Enlarged prostate without lower urinary tract symptoms: Secondary | ICD-10-CM | POA: Diagnosis not present

## 2021-12-11 DIAGNOSIS — F419 Anxiety disorder, unspecified: Secondary | ICD-10-CM | POA: Diagnosis not present

## 2021-12-11 DIAGNOSIS — Z0000 Encounter for general adult medical examination without abnormal findings: Secondary | ICD-10-CM | POA: Diagnosis not present

## 2021-12-11 DIAGNOSIS — I10 Essential (primary) hypertension: Secondary | ICD-10-CM | POA: Diagnosis not present

## 2021-12-11 DIAGNOSIS — Z6834 Body mass index (BMI) 34.0-34.9, adult: Secondary | ICD-10-CM

## 2021-12-11 DIAGNOSIS — F1090 Alcohol use, unspecified, uncomplicated: Secondary | ICD-10-CM

## 2021-12-11 DIAGNOSIS — E7800 Pure hypercholesterolemia, unspecified: Secondary | ICD-10-CM

## 2021-12-11 DIAGNOSIS — Z1211 Encounter for screening for malignant neoplasm of colon: Secondary | ICD-10-CM | POA: Diagnosis not present

## 2021-12-11 DIAGNOSIS — Z789 Other specified health status: Secondary | ICD-10-CM

## 2021-12-11 DIAGNOSIS — E6609 Other obesity due to excess calories: Secondary | ICD-10-CM

## 2021-12-11 DIAGNOSIS — Z800 Family history of malignant neoplasm of digestive organs: Secondary | ICD-10-CM

## 2021-12-11 DIAGNOSIS — E66811 Obesity, class 1: Secondary | ICD-10-CM

## 2021-12-11 DIAGNOSIS — F17210 Nicotine dependence, cigarettes, uncomplicated: Secondary | ICD-10-CM

## 2021-12-11 LAB — MICROALBUMIN, URINE WAIVED
Creatinine, Urine Waived: 200 mg/dL (ref 10–300)
Microalb, Ur Waived: 10 mg/L (ref 0–19)
Microalb/Creat Ratio: <30 mg/g (ref <30)

## 2021-12-11 LAB — BAYER DCA HB A1C WAIVED: HB A1C (BAYER DCA - WAIVED): 5.3 % (ref 4.8–5.6)

## 2021-12-11 NOTE — Assessment & Plan Note (Signed)
I have recommended complete cessation of tobacco use. I have discussed various options available for assistance with tobacco cessation including over the counter methods (Nicotine gum, patch and lozenges). We also discussed prescription options (Chantix, Nicotine Inhaler / Nasal Spray). The patient is not interested in pursuing any prescription tobacco cessation options at this time.  Discussed lung cancer screening and recommend he obtain this -- provided him pamphlet on this.  

## 2021-12-11 NOTE — Assessment & Plan Note (Addendum)
In father, patient is due for colonoscopy (G9FA) and discussed with him -- referral placed for this.  Lipase and Amylase check today.

## 2021-12-11 NOTE — Assessment & Plan Note (Signed)
A few beers a night with AUDIT 11 -- recommend he cut back on alcohol use which would benefit blood pressure and overall health.  CMP today.

## 2021-12-11 NOTE — Assessment & Plan Note (Signed)
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  Recommend he monitor BP at least a few mornings a week at home, 2 hours after taking medication, and document for all visits.  Discussed with him the benefit of knowing home BP levels for medication changes.  DASH diet at home.  Continue current medication regimen and adjust as needed.  Labs today: CMP, CBC, TSH, lipid, urine ALB.  Return in 6 months.

## 2021-12-11 NOTE — Assessment & Plan Note (Signed)
Chronic, ongoing.  Continue current medication regimen and adjust as needed.  CMP and lipid panel today. 

## 2021-12-11 NOTE — Assessment & Plan Note (Signed)
BMI 32.25.  Recommended eating smaller high protein, low fat meals more frequently and exercising 30 mins a day 5 times a week with a goal of 10-15lb weight loss in the next 3 months. Patient voiced their understanding and motivation to adhere to these recommendations.  

## 2021-12-11 NOTE — Progress Notes (Signed)
BP 132/84 (BP Location: Left Arm, Patient Position: Sitting, Cuff Size: Large)    Pulse 86    Temp 97.9 F (36.6 C) (Oral)    Resp 20    Ht 5\' 9"  (1.753 m)    Wt 218 lb 6.4 oz (99.1 kg)    SpO2 98%    BMI 32.25 kg/m    Subjective:    Patient ID: Kevin Buckley, male    DOB: May 29, 1966, 56 y.o.   MRN: [id number]  HPI: Kevin Buckley is a 56 y.o. male presenting on 12/11/2021 for comprehensive medical examination. Current medical complaints include:none  He currently lives with: significant other and son Interim Problems from his last visit: no  HYPERTENSION Continues on Losartan-HCTZ 100-12.5 MG.  Has Moyers coat syndrome with provider visits.  Does drink alcohol, 4-5 beers a night.  Is a smoker -- smokes 1 1/2 PPD, has been smoking since mid-20's on and off.   Hypertension status: stable  Satisfied with current treatment? yes Duration of hypertension: chronic BP monitoring frequency:  not checking BP range:  BP medication side effects:  no Medication compliance: good compliance Previous BP meds: as above Aspirin: no Recurrent headaches: no Visual changes: no Palpitations: no Dyspnea: no Chest pain: no Lower extremity edema: no Dizzy/lightheaded: no  Flowsheet Row Office Visit from 09/08/2021 in [place]  AUDIT-C Score 7       HYPERLIPIDEMIA Currently taking Atorvastatin and Fenofibrate, has been taking 4-5 years.   Hyperlipidemia status: good compliance Satisfied with current treatment?  yes Side effects:  no Medication compliance: good compliance Past cholesterol meds: as above Supplements: none Aspirin:  no The 10-year ASCVD risk score (Arnett DK, et al., 2019) is: 10.4%   Values used to calculate the score:     Age: 55 years     Sex: Male     Is Non-Hispanic African American: No     Diabetic: No     Tobacco smoker: Yes     Systolic Blood Pressure: [id number] mmHg     Is BP treated: Yes     HDL Cholesterol: 51 mg/dL     Total Cholesterol: 169 mg/dL Chest  pain:  no Coronary artery disease:  no Family history CAD:  no Family history early CAD:  no    ANXIETY/STRESS Taking Sertraline and Buspar. Duration:stable Anxious mood: yes , especially with office visits Excessive worrying: no Irritability: no  Sweating: no Nausea: no Palpitations:no Hyperventilation: no Panic attacks: no -- not in a long time Agoraphobia: no  Obscessions/compulsions: no Depressed mood: no Depression screen [facility] 2/9 12/11/2021 09/08/2021  Decreased Interest 0 0  Down, Depressed, Hopeless 0 0  PHQ - 2 Score 0 0  Altered sleeping 0 0  Tired, decreased energy 0 0  Change in appetite 0 0  Feeling bad or failure about yourself  0 0  Trouble concentrating 0 0  Moving slowly or fidgety/restless 0 0  Suicidal thoughts 0 0  PHQ-9 Score 0 0  Difficult doing work/chores - Not difficult at all    GAD 7 : Generalized Anxiety Score 12/11/2021 09/08/2021  Nervous, Anxious, on Edge 1 0  Control/stop worrying 0 0  Worry too much - different things 0 0  Trouble relaxing 0 0  Restless 0 0  Easily annoyed or irritable 0 0  Afraid - awful might happen 0 0  Total GAD 7 Score 1 0  Anxiety Difficulty Not difficult at all Not difficult at all   Functional Status  Survey: Is the patient deaf or have difficulty hearing?: No Does the patient have difficulty seeing, even when wearing glasses/contacts?: No Does the patient have difficulty concentrating, remembering, or making decisions?: No Does the patient have difficulty walking or climbing stairs?: No Does the patient have difficulty dressing or bathing?: No Does the patient have difficulty doing errands alone such as visiting a doctor's office or shopping?: No  FALL RISK: Fall Risk  12/11/2021 12/11/2021 09/08/2021  Falls in the past year? 0 0 0  Number falls in past yr: 0 0 0  Injury with Fall? 0 0 0  Risk for fall due to : No Fall Risks No Fall Risks -  Follow up Falls prevention discussed Falls evaluation completed -    Advanced Directives <no information>  Past Medical History:  Past Medical History:  Diagnosis Date   Anxiety    Elevated cholesterol    Hypertension     Surgical History:  History reviewed. No pertinent surgical history.  Medications:  Current Outpatient Medications on File Prior to Visit  Medication Sig   atorvastatin (LIPITOR) 20 MG tablet Take 1 tablet (20 mg total) by mouth daily.   busPIRone (BUSPAR) 10 MG tablet Take 1 tablet (10 mg total) by mouth daily.   fenofibrate micronized (LOFIBRA) 134 MG capsule Take 1 capsule (134 mg total) by mouth daily before breakfast.   losartan-hydrochlorothiazide (HYZAAR) 100-12.5 MG tablet Take 1 tablet by mouth daily.   sertraline (ZOLOFT) 100 MG tablet Take 1.5 tablets (150 mg total) by mouth daily.   No current facility-administered medications on file prior to visit.    Allergies:  No Known Allergies  Social History:  Social History   Socioeconomic History   Marital status: Married    Spouse name: Not on file   Number of children: 1   Years of education: Not on file   Highest education level: Not on file  Occupational History   Not on file  Tobacco Use   Smoking status: Every Day    Types: Cigarettes   Smokeless tobacco: Never  Substance and Sexual Activity   Alcohol use: Yes    Alcohol/week: 38.0 standard drinks    Types: 38 Cans of beer per week   Drug use: No   Sexual activity: Yes  Other Topics Concern   Not on file  Social History Narrative   Not on file   Social Determinants of Health   Financial Resource Strain: Low Risk    Difficulty of Paying Living Expenses: Not hard at all  Food Insecurity: No Food Insecurity   Worried About Charity fundraiser in the Last Year: Never true   [place] in the Last Year: Never true  Transportation Needs: No Transportation Needs   Lack of Transportation (Medical): No   Lack of Transportation (Non-Medical): No  Physical Activity: Insufficiently Active    Days of Exercise per Week: 3 days   Minutes of Exercise per Session: 30 min  Stress: No Stress Concern Present   Feeling of Stress : Not at all  Social Connections: Moderately Isolated   Frequency of Communication with Friends and Family: Three times a week   Frequency of Social Gatherings with Friends and Family: Three times a week   Attends Religious Services: Never   Active Member of Clubs or Organizations: No   Attends Archivist Meetings: Never   Marital Status: Married  Human resources officer Violence: Not At Risk   Fear of Current or Ex-Partner: No  Emotionally Abused: No   Physically Abused: No   Sexually Abused: No   Social History   Tobacco Use  Smoking Status Every Day   Types: Cigarettes  Smokeless Tobacco Never   Social History   Substance and Sexual Activity  Alcohol Use Yes   Alcohol/week: 38.0 standard drinks   Types: 35 Cans of beer per week    Family History:  Family History  Problem Relation Age of Onset   Multiple sclerosis Mother    Pancreatic cancer Father    Hypertension Paternal Grandmother     Past medical history, surgical history, medications, allergies, family history and social history reviewed with patient today and changes made to appropriate areas of the chart.   ROS All other ROS negative except what is listed above and in the HPI.      Objective:    BP 132/84 (BP Location: Left Arm, Patient Position: Sitting, Cuff Size: Large)    Pulse 86    Temp 97.9 F (36.6 C) (Oral)    Resp 20    Ht 5\' 9"  (1.753 m)    Wt 218 lb 6.4 oz (99.1 kg)    SpO2 98%    BMI 32.25 kg/m   Wt Readings from Last 3 Encounters:  12/11/21 218 lb 6.4 oz (99.1 kg)  09/08/21 224 lb (101.6 kg)  08/21/19 240 lb (108.9 kg)    Physical Exam Vitals and nursing note reviewed.  Constitutional:      General: He is awake. He is not in acute distress.    Appearance: He is well-developed and well-groomed. He is not ill-appearing or toxic-appearing.  HENT:      Head: Normocephalic and atraumatic.     Right Ear: Hearing, tympanic membrane, ear canal and external ear normal. No drainage.     Left Ear: Hearing, tympanic membrane, ear canal and external ear normal. No drainage.     Nose: Nose normal.     Mouth/Throat:     Pharynx: Uvula midline.  Eyes:     General: Lids are normal.        Right eye: No discharge.        Left eye: No discharge.     Extraocular Movements: Extraocular movements intact.     Conjunctiva/sclera: Conjunctivae normal.     Pupils: Pupils are equal, round, and reactive to light.     Visual Fields: Right eye visual fields normal and left eye visual fields normal.  Neck:     Thyroid: No thyromegaly.     Vascular: No carotid bruit or JVD.     Trachea: Trachea normal.  Cardiovascular:     Rate and Rhythm: Normal rate and regular rhythm.     Heart sounds: Normal heart sounds, S1 normal and S2 normal. No murmur heard.   No gallop.  Pulmonary:     Effort: Pulmonary effort is normal. No accessory muscle usage or respiratory distress.     Breath sounds: Normal breath sounds.  Abdominal:     General: Bowel sounds are normal.     Palpations: Abdomen is soft. There is no hepatomegaly or splenomegaly.     Tenderness: There is no abdominal tenderness.  Musculoskeletal:        General: Normal range of motion.     Cervical back: Normal range of motion and neck supple.     Right lower leg: No edema.     Left lower leg: No edema.  Lymphadenopathy:     Head:     Right  side of head: No submental, submandibular, tonsillar, preauricular or posterior auricular adenopathy.     Left side of head: No submental, submandibular, tonsillar, preauricular or posterior auricular adenopathy.     Cervical: No cervical adenopathy.  Skin:    General: Skin is warm and dry.     Capillary Refill: Capillary refill takes less than 2 seconds.     Findings: No rash.  Neurological:     Mental Status: He is alert and oriented to person, place, and time.      Gait: Gait is intact.     Deep Tendon Reflexes: Reflexes are normal and symmetric.     Reflex Scores:      Brachioradialis reflexes are 2+ on the right side and 2+ on the left side.      Patellar reflexes are 2+ on the right side and 2+ on the left side. Psychiatric:        Attention and Perception: Attention normal.        Mood and Affect: Mood normal.        Speech: Speech normal.        Behavior: Behavior normal. Behavior is cooperative.        Thought Content: Thought content normal.        Cognition and Memory: Cognition normal.    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  Recommend he monitor BP at least a few mornings a week at home, 2 hours after taking medication, and document for all visits.  Discussed with him the benefit of knowing home BP levels for medication changes.  DASH diet at home.  Continue current medication regimen and adjust as needed.  Labs today: CMP, CBC, TSH, lipid, urine ALB.  Return in 6 months.      Relevant Orders   Comprehensive metabolic panel   CBC with Differential/Platelet   TSH     Endocrine   IFG (impaired fasting glucose)    A1c 5.3% today, discussed with patient.  Stable.      Relevant Orders   Bayer DCA Hb A1c Waived (Completed)   Microalbumin, Urine Waived (Completed)     Other   Alcohol use    A few beers a night with AUDIT 11 -- recommend he cut back on alcohol use which would benefit blood  pressure and overall health.  CMP today.      Relevant Orders   Comprehensive metabolic panel   Anxiety    Chronic, ongoing.  Denies SI/HI.  At this time continue current medication regimen and adjust as needed.  Refills up to date.  Severe anxiety in practice setting.      Relevant Orders   TSH   Family history of pancreatic cancer    In father, patient is due for colonoscopy [id number]) and discussed with him -- referral placed for this.  Lipase  and Amylase check today.      Relevant Orders   Comprehensive metabolic panel   Lipase   Amylase   Ambulatory referral to Gastroenterology   Nicotine dependence, cigarettes, uncomplicated    I have recommended complete cessation of tobacco use. I have discussed various options available for assistance with tobacco cessation including over the counter methods (Nicotine gum, patch and lozenges). We also discussed prescription options (Chantix, Nicotine Inhaler / Nasal Spray). The patient is not interested in pursuing any prescription tobacco cessation options at this time.  Discussed lung cancer screening and recommend he obtain this -- provided him pamphlet on this.       Obesity    BMI 32.25.  Recommended eating smaller high protein, low fat meals more frequently and exercising 30 mins a day 5 times a week with a goal of 10-15lb weight loss in the next 3 months. Patient voiced their understanding and motivation to adhere to these recommendations.       Pure hypercholesterolemia    Chronic, ongoing.  Continue current medication regimen and adjust as needed.  CMP and lipid panel today.         Relevant Orders   Comprehensive metabolic panel   Lipid Panel w/o Chol/HDL Ratio   Other Visit Diagnoses     Benign prostatic hyperplasia without lower urinary tract symptoms       Check PSA on labs today.   Relevant Orders   PSA   Colon cancer screening       GI referral placed.   Relevant Orders   Ambulatory referral to Gastroenterology    Encounter for annual physical exam       Annual physical with labs performed and health maintenance reviewed.       Discussed aspirin prophylaxis for myocardial infarction prevention and decision was that we recommended ASA, and patient refused  LABORATORY TESTING:  Health maintenance labs ordered today as discussed above.   The natural history of prostate cancer and ongoing controversy regarding screening and potential treatment outcomes of prostate cancer has been discussed with the patient. The meaning of a false positive PSA and a false negative PSA has been discussed. He indicates understanding of the limitations of this screening test and wishes to proceed with screening PSA testing.   IMMUNIZATIONS:   - Tdap: Tetanus vaccination status reviewed: refuses. - Influenza: Refused - Pneumovax: Refused - Prevnar: Refused - Zostavax vaccine: Refused  SCREENING: - Colonoscopy: Ordered today  Discussed with patient purpose of the colonoscopy is to detect colon cancer at curable precancerous or early stages   - AAA Screening: Not applicable  -Hearing Test: Not applicable  -Spirometry: Not applicable   PATIENT COUNSELING:    Sexuality: Discussed sexually transmitted diseases, partner selection, use of condoms, avoidance of unintended pregnancy  and contraceptive alternatives.   Advised to avoid cigarette smoking.  I discussed with the patient that most people either abstain from alcohol or drink within safe limits (<=14/week and <=4 drinks/occasion for males, <=7/weeks and <= 3 drinks/occasion for females) and that the risk for alcohol disorders and other health effects rises proportionally with the number of drinks per week and how often a drinker exceeds daily limits.  Discussed cessation/primary prevention of drug use and availability of treatment for abuse.   Diet: Encouraged to adjust caloric intake to maintain  or achieve ideal body weight, to reduce intake of dietary saturated  fat and total fat, to limit sodium intake by avoiding high sodium foods and not adding table  salt, and to maintain adequate dietary potassium and calcium preferably from fresh fruits, vegetables, and low-fat dairy products.    Stressed the importance of regular exercise  Injury prevention: Discussed safety belts, safety helmets, smoke detector, smoking near bedding or upholstery.   Dental health: Discussed importance of regular tooth brushing, flossing, and dental visits.   Follow up plan: NEXT PREVENTATIVE PHYSICAL DUE IN 1 YEAR. Return in about 6 months (around 06/10/2022) for HTN/HLD, ALCOHOL USE.

## 2021-12-11 NOTE — Patient Instructions (Signed)

## 2021-12-11 NOTE — Assessment & Plan Note (Signed)
A1c 5.3% today, discussed with patient.  Stable.

## 2021-12-11 NOTE — Assessment & Plan Note (Signed)
Chronic, ongoing.  Denies SI/HI.  At this time continue current medication regimen and adjust as needed.  Refills up to date.  Severe anxiety in practice setting.

## 2021-12-12 ENCOUNTER — Other Ambulatory Visit: Payer: Self-pay | Admitting: Nurse Practitioner

## 2021-12-12 LAB — COMPREHENSIVE METABOLIC PANEL
ALT: 16 IU/L (ref 0–44)
AST: 18 IU/L (ref 0–40)
Albumin/Globulin Ratio: 2.2 (ref 1.2–2.2)
Albumin: 4.4 g/dL (ref 3.8–4.9)
Alkaline Phosphatase: 62 IU/L (ref 44–121)
BUN/Creatinine Ratio: 12 (ref 9–20)
BUN: 13 mg/dL (ref 6–24)
Bilirubin Total: 0.5 mg/dL (ref 0.0–1.2)
CO2: 23 mmol/L (ref 20–29)
Calcium: 9.1 mg/dL (ref 8.7–10.2)
Chloride: 102 mmol/L (ref 96–106)
Creatinine, Ser: 1.05 mg/dL (ref 0.76–1.27)
Globulin, Total: 2 g/dL (ref 1.5–4.5)
Glucose: 97 mg/dL (ref 70–99)
Potassium: 4.1 mmol/L (ref 3.5–5.2)
Sodium: 140 mmol/L (ref 134–144)
Total Protein: 6.4 g/dL (ref 6.0–8.5)
eGFR: 84 mL/min/{1.73_m2} (ref >59)

## 2021-12-12 LAB — CBC WITH DIFFERENTIAL/PLATELET
Basophils Absolute: 0.1 10*3/uL (ref 0.0–0.2)
Basos: 1 %
EOS (ABSOLUTE): 0.2 10*3/uL (ref 0.0–0.4)
Eos: 3 %
Hematocrit: 43.9 % (ref 37.5–51.0)
Hemoglobin: 15.3 g/dL (ref 13.0–17.7)
Immature Grans (Abs): 0 10*3/uL (ref 0.0–0.1)
Immature Granulocytes: 0 %
Lymphocytes Absolute: 2.4 10*3/uL (ref 0.7–3.1)
Lymphs: 29 %
MCH: 32.6 pg (ref 26.6–33.0)
MCHC: 34.9 g/dL (ref 31.5–35.7)
MCV: 93 fL (ref 79–97)
Monocytes Absolute: 0.5 10*3/uL (ref 0.1–0.9)
Monocytes: 6 %
Neutrophils Absolute: 5 10*3/uL (ref 1.4–7.0)
Neutrophils: 61 %
Platelets: 270 10*3/uL (ref 150–450)
RBC: 4.7 x10E6/uL (ref 4.14–5.80)
RDW: 12.2 % (ref 11.6–15.4)
WBC: 8.3 10*3/uL (ref 3.4–10.8)

## 2021-12-12 LAB — LIPID PANEL W/O CHOL/HDL RATIO
Cholesterol, Total: 170 mg/dL (ref 100–199)
HDL: 43 mg/dL (ref >39)
LDL Chol Calc (NIH): 101 mg/dL — ABNORMAL HIGH (ref 0–99)
Triglycerides: 149 mg/dL (ref 0–149)
VLDL Cholesterol Cal: 26 mg/dL (ref 5–40)

## 2021-12-12 LAB — PSA: Prostate Specific Ag, Serum: 0.8 ng/mL (ref 0.0–4.0)

## 2021-12-12 LAB — LIPASE: Lipase: 57 U/L (ref 13–78)

## 2021-12-12 LAB — TSH: TSH: 2.23 u[IU]/mL (ref 0.450–4.500)

## 2021-12-12 LAB — AMYLASE: Amylase: 35 U/L (ref 31–110)

## 2021-12-12 MED ORDER — ATORVASTATIN CALCIUM 40 MG PO TABS: 40.0000 mg | ORAL_TABLET | Freq: Every day | ORAL | 4 refills | Status: DC

## 2021-12-12 NOTE — Progress Notes (Signed)
Contacted via MyChart   Good morning Kevin Buckley, your labs have returned and overall look great with exception of LDL (bad cholesterol) continuing to be elevated.  I am going to increase your Atorvastatin to 40 MG, if you have 20 MG pills left you can start taking 2 of these a day until they are all gone and then pick up new higher dose pills.  Please cut back on alcohol use and smoking to help lower your risk of heart attack and stroke as well. Any questions? Keep being amazing!!  Thank you for allowing me to participate in your care.  I appreciate you. Kindest regards, Linell Shawn

## 2022-05-27 ENCOUNTER — Encounter: Payer: Self-pay | Admitting: Nurse Practitioner

## 2022-06-11 ENCOUNTER — Ambulatory Visit: Payer: BC Managed Care – PPO | Admitting: Nurse Practitioner

## 2022-06-13 NOTE — Patient Instructions (Signed)

## 2022-06-15 ENCOUNTER — Ambulatory Visit: Payer: BC Managed Care – PPO | Admitting: Nurse Practitioner

## 2022-06-16 ENCOUNTER — Encounter: Payer: Self-pay | Admitting: Nurse Practitioner

## 2022-06-16 ENCOUNTER — Ambulatory Visit (INDEPENDENT_AMBULATORY_CARE_PROVIDER_SITE_OTHER): Payer: BC Managed Care – PPO | Admitting: Nurse Practitioner

## 2022-06-16 VITALS — BP 152/98 | HR 90 | Temp 98.3°F | Ht 69.02 in | Wt 214.1 lb

## 2022-06-16 DIAGNOSIS — Z789 Other specified health status: Secondary | ICD-10-CM

## 2022-06-16 DIAGNOSIS — I10 Essential (primary) hypertension: Secondary | ICD-10-CM

## 2022-06-16 DIAGNOSIS — Z6834 Body mass index (BMI) 34.0-34.9, adult: Secondary | ICD-10-CM

## 2022-06-16 DIAGNOSIS — E7800 Pure hypercholesterolemia, unspecified: Secondary | ICD-10-CM | POA: Diagnosis not present

## 2022-06-16 DIAGNOSIS — F419 Anxiety disorder, unspecified: Secondary | ICD-10-CM

## 2022-06-16 DIAGNOSIS — E6609 Other obesity due to excess calories: Secondary | ICD-10-CM

## 2022-06-16 DIAGNOSIS — F17210 Nicotine dependence, cigarettes, uncomplicated: Secondary | ICD-10-CM

## 2022-06-16 MED ORDER — AMLODIPINE BESYLATE 5 MG PO TABS: 5.0000 mg | ORAL_TABLET | Freq: Every day | ORAL | 4 refills | Status: DC

## 2022-06-16 NOTE — Assessment & Plan Note (Signed)
I have recommended complete cessation of tobacco use. I have discussed various options available for assistance with tobacco cessation including over the counter methods (Nicotine gum, patch and lozenges). We also discussed prescription options (Chantix, Nicotine Inhaler / Nasal Spray). The patient is not interested in pursuing any prescription tobacco cessation options at this time.  Discussed lung cancer screening and recommend he obtain this -- provided him pamphlet on this.

## 2022-06-16 NOTE — Assessment & Plan Note (Signed)
Chronic, ongoing with elevation in office, improved on recheck but remains above goal.  Recommend he monitor BP at least a few mornings a week at home, 2 hours after taking medication, and document for all visits.  Discussed with him the benefit of knowing home BP levels for medication changes.  DASH diet at home.  Continue current medication regimen, but add on Amlodipine 5 MG daily -- educated him on this medication at length.  Recommend reduction on alcohol use and smoking.  Labs today: CMP.  Return in 6 weeks.

## 2022-06-16 NOTE — Assessment & Plan Note (Signed)
Chronic, stable.  Denies SI/HI.  At this time continue current medication regimen and adjust as needed.  Refills up to date.  Severe anxiety in practice setting. 

## 2022-06-16 NOTE — Assessment & Plan Note (Signed)
Chronic, ongoing.  Continue current medication regimen and adjust as needed.  CMP and lipid panel today. 

## 2022-06-16 NOTE — Assessment & Plan Note (Signed)
A few beers a night with AUDIT 11 last check -- recommend he cut back on alcohol use which would benefit blood pressure and overall health.  CMP today.   

## 2022-06-16 NOTE — Assessment & Plan Note (Signed)
BMI 31.60.  Recommended eating smaller high protein, low fat meals more frequently and exercising 30 mins a day 5 times a week with a goal of 10-15lb weight loss in the next 3 months. Patient voiced their understanding and motivation to adhere to these recommendations. 

## 2022-06-16 NOTE — Progress Notes (Signed)
BP (!) 152/98 (BP Location: Left Arm, Patient Position: Sitting, Cuff Size: Normal)   Pulse 90   Temp 98.3 F (36.8 C) (Oral)   Ht 5' 9.02" (1.753 m)   Wt 214 lb 1.6 oz (97.1 kg)   SpO2 97%   BMI 31.60 kg/m    Subjective:    Patient ID: Kevin Buckley, male    DOB: Feb 20, 1966, 56 y.o.   MRN: [id number]  HPI: Kevin Buckley is a 56 y.o. male  Chief Complaint  Patient presents with   Hypertension   HYPERTENSION / HYPERLIPIDEMIA Continues on Losartan-HCTZ 100-12.5 MG dosing.  Is taking Atorvastatin and Fenofibrate for HLD.  Does drink alcohol, 4-5 beers a night.  Is a smoker -- smokes 1 1/2 PPD, has been smoking since mid-20's on and off.  Occasionally misses doses of medication, rare episodes. Satisfied with current treatment? yes Duration of hypertension: chronic BP monitoring frequency: weekly BP range: 140/80 range BP medication side effects: no Duration of hyperlipidemia: chronic Cholesterol medication side effects: no Cholesterol supplements: none Medication compliance: fair compliance Aspirin: no Recent stressors: no Recurrent headaches: no Visual changes: no Palpitations: no Dyspnea: no Chest pain: no Lower extremity edema: no Dizzy/lightheaded: no  The 10-year ASCVD risk score (Arnett DK, et al., 2019) is: 16.2%   Values used to calculate the score:     Age: 56 years     Sex: Male     Is Non-Hispanic African American: No     Diabetic: No     Tobacco smoker: Yes     Systolic Blood Pressure: 546 mmHg     Is BP treated: Yes     HDL Cholesterol: 43 mg/dL     Total Cholesterol: 170 mg/dL  DEPRESSION Taking Sertraline and Buspar. Mood status: stable Satisfied with current treatment?: yes Symptom severity: moderate  Duration of current treatment : chronic Side effects: no Medication compliance: good compliance Psychotherapy/counseling: none Depressed mood: no Anxious mood:  occasional Anhedonia: no Significant weight loss or gain: no Insomnia:  none Fatigue: no Feelings of worthlessness or guilt: no Impaired concentration/indecisiveness: no Suicidal ideations: no Hopelessness: no Crying spells: no    06/16/2022    2:29 PM 12/11/2021    2:16 PM 09/08/2021   11:01 AM  Depression screen PHQ 2/9  Decreased Interest 0 0 0  Down, Depressed, Hopeless 0 0 0  PHQ - 2 Score 0 0 0  Altered sleeping 0 0 0  Tired, decreased energy 1 0 0  Change in appetite 0 0 0  Feeling bad or failure about yourself  0 0 0  Trouble concentrating 0 0 0  Moving slowly or fidgety/restless 0 0 0  Suicidal thoughts 0 0 0  PHQ-9 Score 1 0 0  Difficult doing work/chores Not difficult at all  Not difficult at all       06/16/2022    2:29 PM 12/11/2021    2:16 PM 09/08/2021   11:02 AM  GAD 7 : Generalized Anxiety Score  Nervous, Anxious, on Edge 0 1 0  Control/stop worrying 0 0 0  Worry too much - different things 0 0 0  Trouble relaxing 0 0 0  Restless 0 0 0  Easily annoyed or irritable 0 0 0  Afraid - awful might happen 0 0 0  Total GAD 7 Score 0 1 0  Anxiety Difficulty Not difficult at all Not difficult at all Not difficult at all      Relevant past medical, surgical, family and  social history reviewed and updated as indicated. Interim medical history since our last visit reviewed. Allergies and medications reviewed and updated.  Review of Systems  Constitutional:  Negative for activity change, diaphoresis, fatigue and fever.  Respiratory:  Negative for cough, chest tightness, shortness of breath and wheezing.   Cardiovascular:  Negative for chest pain, palpitations and leg swelling.  Gastrointestinal: Negative.   Neurological: Negative.   Psychiatric/Behavioral: Negative.      Per HPI unless specifically indicated above     Objective:    BP (!) 152/98 (BP Location: Left Arm, Patient Position: Sitting, Cuff Size: Normal)   Pulse 90   Temp 98.3 F (36.8 C) (Oral)   Ht 5' 9.02" (1.753 m)   Wt 214 lb 1.6 oz (97.1 kg)   SpO2 97%   BMI  31.60 kg/m   Wt Readings from Last 3 Encounters:  06/16/22 214 lb 1.6 oz (97.1 kg)  12/11/21 218 lb 6.4 oz (99.1 kg)  09/08/21 224 lb (101.6 kg)    Physical Exam Vitals and nursing note reviewed.  Constitutional:      General: He is awake. He is not in acute distress.    Appearance: He is well-developed and well-groomed. He is obese. He is not ill-appearing or toxic-appearing.  HENT:     Head: Normocephalic and atraumatic.     Right Ear: Hearing normal. No drainage.     Left Ear: Hearing normal. No drainage.  Eyes:     General: Lids are normal.        Right eye: No discharge.        Left eye: No discharge.     Conjunctiva/sclera: Conjunctivae normal.     Pupils: Pupils are equal, round, and reactive to light.  Neck:     Thyroid: No thyromegaly.     Vascular: No carotid bruit.  Cardiovascular:     Rate and Rhythm: Normal rate and regular rhythm.     Heart sounds: Normal heart sounds, S1 normal and S2 normal. No murmur heard.    No gallop.  Pulmonary:     Effort: Pulmonary effort is normal. No accessory muscle usage or respiratory distress.     Breath sounds: Normal breath sounds.  Abdominal:     General: Bowel sounds are normal.     Palpations: Abdomen is soft. There is no hepatomegaly or splenomegaly.  Musculoskeletal:        General: Normal range of motion.     Cervical back: Normal range of motion and neck supple.     Right lower leg: No edema.     Left lower leg: No edema.  Skin:    General: Skin is warm and dry.     Capillary Refill: Capillary refill takes less than 2 seconds.     Findings: No rash.  Neurological:     Mental Status: He is alert and oriented to person, place, and time.     Deep Tendon Reflexes: Reflexes are normal and symmetric.     Reflex Scores:      Brachioradialis reflexes are 2+ on the right side and 2+ on the left side.      Patellar reflexes are 2+ on the right side and 2+ on the left side. Psychiatric:        Attention and Perception:  Attention normal.        Mood and Affect: Mood normal.        Speech: Speech normal.        Behavior: Behavior normal.  Behavior is cooperative.        Thought Content: Thought content normal.     Results for orders placed or performed in visit on 12/11/21  Bayer DCA Hb A1c Waived  Result Value Ref Range   HB A1C (BAYER DCA - WAIVED) 5.3 4.8 - 5.6 %  Microalbumin, Urine Waived  Result Value Ref Range   Microalb, Ur Waived 10 0 - 19 mg/L   Creatinine, Urine Waived 200 10 - 300 mg/dL   Microalb/Creat Ratio <30 <30 mg/g  Comprehensive metabolic panel  Result Value Ref Range   Glucose 97 70 - 99 mg/dL   BUN 13 6 - 24 mg/dL   Creatinine, Ser 1.05 0.76 - 1.27 mg/dL   eGFR 84 >59 mL/min/1.73   BUN/Creatinine Ratio 12 9 - 20   Sodium 140 134 - 144 mmol/L   Potassium 4.1 3.5 - 5.2 mmol/L   Chloride 102 96 - 106 mmol/L   CO2 23 20 - 29 mmol/L   Calcium 9.1 8.7 - 10.2 mg/dL   Total Protein 6.4 6.0 - 8.5 g/dL   Albumin 4.4 3.8 - 4.9 g/dL   Globulin, Total 2.0 1.5 - 4.5 g/dL   Albumin/Globulin Ratio 2.2 1.2 - 2.2   Bilirubin Total 0.5 0.0 - 1.2 mg/dL   Alkaline Phosphatase 62 44 - 121 IU/L   AST 18 0 - 40 IU/L   ALT 16 0 - 44 IU/L  CBC with Differential/Platelet  Result Value Ref Range   WBC 8.3 3.4 - 10.8 x10E3/uL   RBC 4.70 4.14 - 5.80 x10E6/uL   Hemoglobin 15.3 13.0 - 17.7 g/dL   Hematocrit 43.9 37.5 - 51.0 %   MCV 93 79 - 97 fL   MCH 32.6 26.6 - 33.0 pg   MCHC 34.9 31.5 - 35.7 g/dL   RDW 12.2 11.6 - 15.4 %   Platelets 270 150 - 450 x10E3/uL   Neutrophils 61 Not Estab. %   Lymphs 29 Not Estab. %   Monocytes 6 Not Estab. %   Eos 3 Not Estab. %   Basos 1 Not Estab. %   Neutrophils Absolute 5.0 1.4 - 7.0 x10E3/uL   Lymphocytes Absolute 2.4 0.7 - 3.1 x10E3/uL   Monocytes Absolute 0.5 0.1 - 0.9 x10E3/uL   EOS (ABSOLUTE) 0.2 0.0 - 0.4 x10E3/uL   Basophils Absolute 0.1 0.0 - 0.2 x10E3/uL   Immature Granulocytes 0 Not Estab. %   Immature Grans (Abs) 0.0 0.0 - 0.1 x10E3/uL   TSH  Result Value Ref Range   TSH 2.230 0.450 - 4.500 uIU/mL  PSA  Result Value Ref Range   Prostate Specific Ag, Serum 0.8 0.0 - 4.0 ng/mL  Lipid Panel w/o Chol/HDL Ratio  Result Value Ref Range   Cholesterol, Total 170 100 - 199 mg/dL   Triglycerides 149 0 - 149 mg/dL   HDL 43 >39 mg/dL   VLDL Cholesterol Cal 26 5 - 40 mg/dL   LDL Chol Calc (NIH) 101 (H) 0 - 99 mg/dL  Lipase  Result Value Ref Range   Lipase 57 13 - 78 U/L  Amylase  Result Value Ref Range   Amylase 35 31 - 110 U/L      Assessment & Plan:   Problem List Items Addressed This Visit       Cardiovascular and Mediastinum   Benign essential hypertension - Primary    Chronic, ongoing with elevation in office, improved on recheck but remains above goal.  Recommend he monitor BP at least a  few mornings a week at home, 2 hours after taking medication, and document for all visits.  Discussed with him the benefit of knowing home BP levels for medication changes.  DASH diet at home.  Continue current medication regimen, but add on Amlodipine 5 MG daily -- educated him on this medication at length.  Recommend reduction on alcohol use and smoking.  Labs today: CMP.  Return in 6 weeks.      Relevant Medications   amLODipine (NORVASC) 5 MG tablet   Other Relevant Orders   Lipid Panel w/o Chol/HDL Ratio     Other   Alcohol use    A few beers a night with AUDIT 11 last check -- recommend he cut back on alcohol use which would benefit blood pressure and overall health.  CMP today.      Relevant Orders   Lipid Panel w/o Chol/HDL Ratio   Anxiety    Chronic, stable.  Denies SI/HI.  At this time continue current medication regimen and adjust as needed.  Refills up to date.  Severe anxiety in practice setting.      Nicotine dependence, cigarettes, uncomplicated    I have recommended complete cessation of tobacco use. I have discussed various options available for assistance with tobacco cessation including over the counter  methods (Nicotine gum, patch and lozenges). We also discussed prescription options (Chantix, Nicotine Inhaler / Nasal Spray). The patient is not interested in pursuing any prescription tobacco cessation options at this time.  Discussed lung cancer screening and recommend he obtain this -- provided him pamphlet on this.       Obesity    BMI 31.60.  Recommended eating smaller high protein, low fat meals more frequently and exercising 30 mins a day 5 times a week with a goal of 10-15lb weight loss in the next 3 months. Patient voiced their understanding and motivation to adhere to these recommendations.       Pure hypercholesterolemia    Chronic, ongoing.  Continue current medication regimen and adjust as needed.  CMP and lipid panel today.         Relevant Medications   amLODipine (NORVASC) 5 MG tablet   Other Relevant Orders   Lipid Panel w/o Chol/HDL Ratio   Comprehensive metabolic panel     Follow up plan: Return in about 6 weeks (around 07/28/2022) for HTN -- added Amlodipine and possibly Td vaccine.

## 2022-06-17 LAB — COMPREHENSIVE METABOLIC PANEL
ALT: 22 IU/L (ref 0–44)
AST: 16 IU/L (ref 0–40)
Albumin/Globulin Ratio: 2 (ref 1.2–2.2)
Albumin: 4.6 g/dL (ref 3.8–4.9)
Alkaline Phosphatase: 75 IU/L (ref 44–121)
BUN/Creatinine Ratio: 11 (ref 9–20)
BUN: 12 mg/dL (ref 6–24)
Bilirubin Total: 0.5 mg/dL (ref 0.0–1.2)
CO2: 21 mmol/L (ref 20–29)
Calcium: 9.6 mg/dL (ref 8.7–10.2)
Chloride: 102 mmol/L (ref 96–106)
Creatinine, Ser: 1.05 mg/dL (ref 0.76–1.27)
Globulin, Total: 2.3 g/dL (ref 1.5–4.5)
Glucose: 90 mg/dL (ref 70–99)
Potassium: 4.3 mmol/L (ref 3.5–5.2)
Sodium: 140 mmol/L (ref 134–144)
Total Protein: 6.9 g/dL (ref 6.0–8.5)
eGFR: 83 mL/min/{1.73_m2} (ref >59)

## 2022-06-17 LAB — LIPID PANEL W/O CHOL/HDL RATIO
Cholesterol, Total: 174 mg/dL (ref 100–199)
HDL: 45 mg/dL (ref >39)
LDL Chol Calc (NIH): 97 mg/dL (ref 0–99)
Triglycerides: 188 mg/dL — ABNORMAL HIGH (ref 0–149)
VLDL Cholesterol Cal: 32 mg/dL (ref 5–40)

## 2022-06-17 NOTE — Progress Notes (Signed)
Contacted via MyChart   Good morning Kevin Buckley, your labs have returned and overall cholesterol levels are starting to improve -- continue Atorvastatin every day and cut back on alcohol intake.  Kidney function, creatinine and eGFR, remains normal, as is liver function, AST and ALT.  Any questions? Keep being amazing!!  Thank you for allowing me to participate in your care.  I appreciate you. Kindest regards, Estel Scholze

## 2022-07-25 NOTE — Patient Instructions (Signed)

## 2022-07-28 ENCOUNTER — Ambulatory Visit (INDEPENDENT_AMBULATORY_CARE_PROVIDER_SITE_OTHER): Payer: BC Managed Care – PPO | Admitting: Nurse Practitioner

## 2022-07-28 ENCOUNTER — Encounter: Payer: Self-pay | Admitting: Nurse Practitioner

## 2022-07-28 VITALS — BP 135/86 | HR 98 | Temp 98.4°F | Ht 69.0 in | Wt 212.6 lb

## 2022-07-28 DIAGNOSIS — F17210 Nicotine dependence, cigarettes, uncomplicated: Secondary | ICD-10-CM

## 2022-07-28 DIAGNOSIS — E6609 Other obesity due to excess calories: Secondary | ICD-10-CM | POA: Diagnosis not present

## 2022-07-28 DIAGNOSIS — I10 Essential (primary) hypertension: Secondary | ICD-10-CM

## 2022-07-28 DIAGNOSIS — Z789 Other specified health status: Secondary | ICD-10-CM | POA: Diagnosis not present

## 2022-07-28 DIAGNOSIS — F1090 Alcohol use, unspecified, uncomplicated: Secondary | ICD-10-CM

## 2022-07-28 DIAGNOSIS — Z6831 Body mass index (BMI) 31.0-31.9, adult: Secondary | ICD-10-CM

## 2022-07-28 NOTE — Assessment & Plan Note (Signed)
A few beers a night with AUDIT 11 last check -- recommend he cut back on alcohol use which would benefit blood pressure and overall health.  CMP up to date.

## 2022-07-28 NOTE — Progress Notes (Signed)
BP 135/86 (BP Location: Left Arm, Cuff Size: Normal)   Pulse 98   Temp 98.4 F (36.9 C) (Oral)   Ht $R'5\' 9"'Nt$  (1.753 m)   Wt 212 lb 9.6 oz (96.4 kg)   SpO2 96%   BMI 31.40 kg/m    Subjective:    Patient ID: Kevin Buckley, male    DOB: 08-14-1966, 56 y.o.   MRN: [id number]  HPI: Kevin Buckley is a 56 y.o. male  Chief Complaint  Patient presents with   Hypertension    Patient is here for Hypertension. Patient was started on medication at her last visit. Patient says he thinks he is doing so far. Patient declines having any problems or concerns at today's visit.    Immunizations    Patient says he is not interested in having any vaccinations at today's visit.    HYPERTENSION  At last visit added on Amlodipine 5 MG to Losartan-HCTZ 100-12.5 MG. Does drink alcohol, 4-5 beers a night -- is trying to reduce.  Is a smoker -- smokes 1 1/2 PPD, has been smoking since mid-20's on and off.  Does not wish to obtain lung cancer screening at this time. Hypertension status: stable  Satisfied with current treatment? yes Duration of hypertension: chronic BP monitoring frequency:  not checking BP range:  BP medication side effects:  no Medication compliance: good compliance Aspirin: no Recurrent headaches: no Visual changes: no Palpitations: no Dyspnea: no Chest pain: no Lower extremity edema: no Dizzy/lightheaded: no The 10-year ASCVD risk score (Arnett DK, et al., 2019) is: 13.1%   Values used to calculate the score:     Age: 59 years     Sex: Male     Is Non-Hispanic African American: No     Diabetic: No     Tobacco smoker: Yes     Systolic Blood Pressure: 301 mmHg     Is BP treated: Yes     HDL Cholesterol: 45 mg/dL     Total Cholesterol: 174 mg/dL    Relevant past medical, surgical, family and social history reviewed and updated as indicated. Interim medical history since our last visit reviewed. Allergies and medications reviewed and updated.  Review of Systems  Constitutional:   Negative for activity change, diaphoresis, fatigue and fever.  Respiratory:  Negative for cough, chest tightness, shortness of breath and wheezing.   Cardiovascular:  Negative for chest pain, palpitations and leg swelling.  Gastrointestinal: Negative.   Neurological: Negative.   Psychiatric/Behavioral: Negative.      Per HPI unless specifically indicated above     Objective:    BP 135/86 (BP Location: Left Arm, Cuff Size: Normal)   Pulse 98   Temp 98.4 F (36.9 C) (Oral)   Ht $R'5\' 9"'hU$  (1.753 m)   Wt 212 lb 9.6 oz (96.4 kg)   SpO2 96%   BMI 31.40 kg/m   Wt Readings from Last 3 Encounters:  07/28/22 212 lb 9.6 oz (96.4 kg)  06/16/22 214 lb 1.6 oz (97.1 kg)  12/11/21 218 lb 6.4 oz (99.1 kg)    Physical Exam Vitals and nursing note reviewed.  Constitutional:      General: He is awake. He is not in acute distress.    Appearance: He is well-developed and well-groomed. He is obese. He is not ill-appearing or toxic-appearing.  HENT:     Head: Normocephalic and atraumatic.     Right Ear: Hearing normal. No drainage.     Left Ear: Hearing normal. No drainage.  Eyes:     General: Lids are normal.        Right eye: No discharge.        Left eye: No discharge.     Conjunctiva/sclera: Conjunctivae normal.     Pupils: Pupils are equal, round, and reactive to light.  Neck:     Thyroid: No thyromegaly.     Vascular: No carotid bruit.  Cardiovascular:     Rate and Rhythm: Normal rate and regular rhythm.     Heart sounds: Normal heart sounds, S1 normal and S2 normal. No murmur heard.    No gallop.  Pulmonary:     Effort: Pulmonary effort is normal. No accessory muscle usage or respiratory distress.     Breath sounds: Normal breath sounds.  Abdominal:     General: Bowel sounds are normal.     Palpations: Abdomen is soft. There is no hepatomegaly or splenomegaly.  Musculoskeletal:        General: Normal range of motion.     Cervical back: Normal range of motion and neck supple.      Right lower leg: No edema.     Left lower leg: No edema.  Skin:    General: Skin is warm and dry.     Capillary Refill: Capillary refill takes less than 2 seconds.     Findings: No rash.  Neurological:     Mental Status: He is alert and oriented to person, place, and time.     Deep Tendon Reflexes: Reflexes are normal and symmetric.     Reflex Scores:      Brachioradialis reflexes are 2+ on the right side and 2+ on the left side.      Patellar reflexes are 2+ on the right side and 2+ on the left side. Psychiatric:        Attention and Perception: Attention normal.        Mood and Affect: Mood normal.        Speech: Speech normal.        Behavior: Behavior normal. Behavior is cooperative.        Thought Content: Thought content normal.    Results for orders placed or performed in visit on 06/16/22  Lipid Panel w/o Chol/HDL Ratio  Result Value Ref Range   Cholesterol, Total 174 100 - 199 mg/dL   Triglycerides 188 (H) 0 - 149 mg/dL   HDL 45 >39 mg/dL   VLDL Cholesterol Cal 32 5 - 40 mg/dL   LDL Chol Calc (NIH) 97 0 - 99 mg/dL  Comprehensive metabolic panel  Result Value Ref Range   Glucose 90 70 - 99 mg/dL   BUN 12 6 - 24 mg/dL   Creatinine, Ser 1.05 0.76 - 1.27 mg/dL   eGFR 83 >59 mL/min/1.73   BUN/Creatinine Ratio 11 9 - 20   Sodium 140 134 - 144 mmol/L   Potassium 4.3 3.5 - 5.2 mmol/L   Chloride 102 96 - 106 mmol/L   CO2 21 20 - 29 mmol/L   Calcium 9.6 8.7 - 10.2 mg/dL   Total Protein 6.9 6.0 - 8.5 g/dL   Albumin 4.6 3.8 - 4.9 g/dL   Globulin, Total 2.3 1.5 - 4.5 g/dL   Albumin/Globulin Ratio 2.0 1.2 - 2.2   Bilirubin Total 0.5 0.0 - 1.2 mg/dL   Alkaline Phosphatase 75 44 - 121 IU/L   AST 16 0 - 40 IU/L   ALT 22 0 - 44 IU/L      Assessment & Plan:  Problem List Items Addressed This Visit       Cardiovascular and Mediastinum   Benign essential hypertension - Primary    Chronic and improved with Amlodipine added on.  Recommend he monitor BP at least a few  mornings a week at home and document.  DASH diet at home.  Continue current medication regimen and adjust as needed.  Labs next visit.  Recommend complete cessation of alcohol use and smoking.  Return in February for physical.         Other   Alcohol use    A few beers a night with AUDIT 11 last check -- recommend he cut back on alcohol use which would benefit blood pressure and overall health.  CMP up to date.      Nicotine dependence, cigarettes, uncomplicated    I have recommended complete cessation of tobacco use. I have discussed various options available for assistance with tobacco cessation including over the counter methods (Nicotine gum, patch and lozenges). We also discussed prescription options (Chantix, Nicotine Inhaler / Nasal Spray). The patient is not interested in pursuing any prescription tobacco cessation options at this time.  Refuses lung cancer screening, highly recommended he obtain.        Obesity    BMI 31.40.  Recommended eating smaller high protein, low fat meals more frequently and exercising 30 mins a day 5 times a week with a goal of 10-15lb weight loss in the next 3 months. Patient voiced their understanding and motivation to adhere to these recommendations.         Follow up plan: Return in about 5 months (around 12/13/2022) for Annual physical.

## 2022-07-28 NOTE — Assessment & Plan Note (Signed)
I have recommended complete cessation of tobacco use. I have discussed various options available for assistance with tobacco cessation including over the counter methods (Nicotine gum, patch and lozenges). We also discussed prescription options (Chantix, Nicotine Inhaler / Nasal Spray). The patient is not interested in pursuing any prescription tobacco cessation options at this time.  Refuses lung cancer screening, highly recommended he obtain.   

## 2022-07-28 NOTE — Assessment & Plan Note (Signed)
Chronic and improved with Amlodipine added on.  Recommend he monitor BP at least a few mornings a week at home and document.  DASH diet at home.  Continue current medication regimen and adjust as needed.  Labs next visit.  Recommend complete cessation of alcohol use and smoking.  Return in February for physical.

## 2022-07-28 NOTE — Assessment & Plan Note (Signed)
BMI 31.40.  Recommended eating smaller high protein, low fat meals more frequently and exercising 30 mins a day 5 times a week with a goal of 10-15lb weight loss in the next 3 months. Patient voiced their understanding and motivation to adhere to these recommendations. ? ?

## 2022-09-25 ENCOUNTER — Other Ambulatory Visit: Payer: Self-pay | Admitting: Nurse Practitioner

## 2022-11-23 ENCOUNTER — Other Ambulatory Visit: Payer: Self-pay | Admitting: Nurse Practitioner

## 2022-11-24 NOTE — Telephone Encounter (Signed)
Requested Prescriptions  Pending Prescriptions Disp Refills   sertraline (ZOLOFT) 100 MG tablet [Pharmacy Med Name: SERTRALINE HCL 100 MG TABLET] 135 tablet 0    Sig: TAKE 1.5 TABLETS (150MG  TOTAL) BY MOUTH DAILY     Psychiatry:  Antidepressants - SSRI - sertraline Passed - 11/23/2022  4:14 PM      Passed - AST in normal range and within 360 days    AST  Date Value Ref Range Status  06/16/2022 16 0 - 40 IU/L Final         Passed - ALT in normal range and within 360 days    ALT  Date Value Ref Range Status  06/16/2022 22 0 - 44 IU/L Final         Passed - Completed PHQ-2 or PHQ-9 in the last 360 days      Passed - Valid encounter within last 6 months    Recent Outpatient Visits           3 months ago Benign essential hypertension   [place], [place] T, NP   5 months ago Benign essential hypertension   [place], [place] T, NP   11 months ago Benign essential hypertension   [place], [place] T, NP   1 year ago Encounter to establish care   [place], Henrine Screws T, NP       Future Appointments             In 3 weeks Cannady, Barbaraann Faster, NP [facility]             losartan-hydrochlorothiazide (HYZAAR) 100-12.5 MG tablet [Pharmacy Med Name: LOSARTAN-HCTZ 100-12.5 MG TAB] 90 tablet 0    Sig: TAKE 1 TABLET BY MOUTH EVERY DAY     Cardiovascular: ARB + Diuretic Combos Passed - 11/23/2022  4:14 PM      Passed - K in normal range and within 180 days    Potassium  Date Value Ref Range Status  06/16/2022 4.3 3.5 - 5.2 mmol/L Final         Passed - Na in normal range and within 180 days    Sodium  Date Value Ref Range Status  06/16/2022 140 134 - 144 mmol/L Final         Passed - Cr in normal range and within 180 days    Creatinine, Ser  Date Value Ref Range Status  06/16/2022 1.05 0.76 - 1.27 mg/dL Final         Passed - eGFR is 10 or above and within 180 days     GFR calc Af Amer  Date Value Ref Range Status  08/21/2019 >60 >60 mL/min Final   GFR calc non Af Amer  Date Value Ref Range Status  08/21/2019 >60 >60 mL/min Final   eGFR  Date Value Ref Range Status  06/16/2022 83 >59 mL/min/1.73 Final         Passed - Patient is not pregnant      Passed - Last BP in normal range    BP Readings from Last 1 Encounters:  07/28/22 135/86         Passed - Valid encounter within last 6 months    Recent Outpatient Visits           3 months ago Benign essential hypertension   [place], [place] T, NP   5 months ago Benign essential hypertension   [place],  Barbaraann Faster, NP   11 months ago Benign essential hypertension   [place], [place] T, NP   1 year ago Encounter to establish care   [place], Barbaraann Faster, NP       Future Appointments             In 3 weeks Cannady, Barbaraann Faster, NP [facility]

## 2022-12-12 NOTE — Patient Instructions (Incomplete)

## 2022-12-17 ENCOUNTER — Encounter: Payer: BC Managed Care – PPO | Admitting: Nurse Practitioner

## 2022-12-17 DIAGNOSIS — F419 Anxiety disorder, unspecified: Secondary | ICD-10-CM

## 2022-12-17 DIAGNOSIS — R7301 Impaired fasting glucose: Secondary | ICD-10-CM

## 2022-12-17 DIAGNOSIS — Z0000 Encounter for general adult medical examination without abnormal findings: Secondary | ICD-10-CM

## 2022-12-17 DIAGNOSIS — Z800 Family history of malignant neoplasm of digestive organs: Secondary | ICD-10-CM

## 2022-12-17 DIAGNOSIS — E7800 Pure hypercholesterolemia, unspecified: Secondary | ICD-10-CM

## 2022-12-17 DIAGNOSIS — F17210 Nicotine dependence, cigarettes, uncomplicated: Secondary | ICD-10-CM

## 2022-12-17 DIAGNOSIS — N400 Enlarged prostate without lower urinary tract symptoms: Secondary | ICD-10-CM

## 2022-12-17 DIAGNOSIS — I10 Essential (primary) hypertension: Secondary | ICD-10-CM

## 2022-12-17 DIAGNOSIS — Z789 Other specified health status: Secondary | ICD-10-CM

## 2022-12-17 DIAGNOSIS — E6609 Other obesity due to excess calories: Secondary | ICD-10-CM

## 2022-12-22 ENCOUNTER — Ambulatory Visit (INDEPENDENT_AMBULATORY_CARE_PROVIDER_SITE_OTHER): Payer: BC Managed Care – PPO | Admitting: Nurse Practitioner

## 2022-12-22 ENCOUNTER — Encounter: Payer: Self-pay | Admitting: Nurse Practitioner

## 2022-12-22 VITALS — BP 144/88 | HR 98 | Temp 98.0°F | Ht 69.02 in | Wt 212.5 lb

## 2022-12-22 DIAGNOSIS — B029 Zoster without complications: Secondary | ICD-10-CM | POA: Insufficient documentation

## 2022-12-22 MED ORDER — VALACYCLOVIR HCL 1 G PO TABS: 1000.0000 mg | ORAL_TABLET | Freq: Three times a day (TID) | ORAL | 0 refills | Status: AC

## 2022-12-22 NOTE — Progress Notes (Signed)
BP (!) 144/88 (BP Location: Left Arm, Patient Position: Sitting, Cuff Size: Normal)   Pulse 98   Temp 98 F (36.7 C) (Oral)   Ht 5' 9.02" (1.753 m)   Wt 212 lb 8 oz (96.4 kg)   SpO2 98%   BMI 31.37 kg/m    Subjective:    Patient ID: Kevin Buckley, male    DOB: May 10, 1966, 57 y.o.   MRN: [id number]  HPI: Kevin Buckley is a 57 y.o. male  Chief Complaint  Patient presents with   Rash    Started a few days ago, rash is posterior left arm. Rash is red swollen blotchy area that are not itchy but burns and hurts a little   RASH Noticed rash to left side posterior arm 2 days ago.  Is red, swollen, painful, and blotchy in nature.  Borrowed a back hoe recently and was doing some clearing, was around some poison oak and ivy, but was not near it -- the back hoe pulled it. Duration:  days  Location: arms left and left upper chest Itching: no Burning: yes Redness: yes Oozing: no Scaling: no Blisters: yes Painful: yes very mild Fevers: no Change in detergents/soaps/personal care products: no Recent illness: no Recent travel:no History of same: no Context: fluctuating Alleviating factors: nothing Treatments attempted: Neosporin Shortness of breath: no  Throat/tongue swelling: no Myalgias/arthralgias: no   Relevant past medical, surgical, family and social history reviewed and updated as indicated. Interim medical history since our last visit reviewed. Allergies and medications reviewed and updated.  Review of Systems  Constitutional:  Negative for activity change, diaphoresis, fatigue and fever.  Respiratory:  Negative for cough, chest tightness, shortness of breath and wheezing.   Cardiovascular:  Negative for chest pain, palpitations and leg swelling.  Gastrointestinal: Negative.   Skin:  Positive for rash.  Neurological: Negative.   Psychiatric/Behavioral: Negative.      Per HPI unless specifically indicated above     Objective:    BP (!) 144/88 (BP Location: Left  Arm, Patient Position: Sitting, Cuff Size: Normal)   Pulse 98   Temp 98 F (36.7 C) (Oral)   Ht 5' 9.02" (1.753 m)   Wt 212 lb 8 oz (96.4 kg)   SpO2 98%   BMI 31.37 kg/m   Wt Readings from Last 3 Encounters:  12/22/22 212 lb 8 oz (96.4 kg)  07/28/22 212 lb 9.6 oz (96.4 kg)  06/16/22 214 lb 1.6 oz (97.1 kg)    Physical Exam Vitals and nursing note reviewed.  Constitutional:      General: He is awake. He is not in acute distress.    Appearance: He is well-developed and well-groomed. He is obese. He is not ill-appearing or toxic-appearing.  HENT:     Head: Normocephalic and atraumatic.     Right Ear: Hearing normal. No drainage.     Left Ear: Hearing normal. No drainage.  Eyes:     General: Lids are normal.        Right eye: No discharge.        Left eye: No discharge.     Conjunctiva/sclera: Conjunctivae normal.     Pupils: Pupils are equal, round, and reactive to light.  Neck:     Thyroid: No thyromegaly.     Vascular: No carotid bruit.  Cardiovascular:     Rate and Rhythm: Normal rate and regular rhythm.     Heart sounds: Normal heart sounds, S1 normal and S2 normal. No murmur heard.  No gallop.  Pulmonary:     Effort: Pulmonary effort is normal. No accessory muscle usage or respiratory distress.     Breath sounds: Normal breath sounds.  Abdominal:     General: Bowel sounds are normal.     Palpations: Abdomen is soft. There is no hepatomegaly or splenomegaly.  Musculoskeletal:        General: Normal range of motion.     Cervical back: Normal range of motion and neck supple.     Right lower leg: No edema.     Left lower leg: No edema.  Skin:    General: Skin is warm and dry.     Capillary Refill: Capillary refill takes less than 2 seconds.     Findings: Rash present. Rash is vesicular.     Comments: Vesicular rash starting to left upper chest and spreading along left inner arm down towards elbow. Vesicles in cluster pattern with erythema around area.  No leakage.   Neurological:     Mental Status: He is alert and oriented to person, place, and time.     Deep Tendon Reflexes: Reflexes are normal and symmetric.     Reflex Scores:      Brachioradialis reflexes are 2+ on the right side and 2+ on the left side.      Patellar reflexes are 2+ on the right side and 2+ on the left side. Psychiatric:        Attention and Perception: Attention normal.        Mood and Affect: Mood normal.        Speech: Speech normal.        Behavior: Behavior normal. Behavior is cooperative.        Thought Content: Thought content normal.     Results for orders placed or performed in visit on 06/16/22  Lipid Panel w/o Chol/HDL Ratio  Result Value Ref Range   Cholesterol, Total 174 100 - 199 mg/dL   Triglycerides 188 (H) 0 - 149 mg/dL   HDL 45 >39 mg/dL   VLDL Cholesterol Cal 32 5 - 40 mg/dL   LDL Chol Calc (NIH) 97 0 - 99 mg/dL  Comprehensive metabolic panel  Result Value Ref Range   Glucose 90 70 - 99 mg/dL   BUN 12 6 - 24 mg/dL   Creatinine, Ser 1.05 0.76 - 1.27 mg/dL   eGFR 83 >59 mL/min/1.73   BUN/Creatinine Ratio 11 9 - 20   Sodium 140 134 - 144 mmol/L   Potassium 4.3 3.5 - 5.2 mmol/L   Chloride 102 96 - 106 mmol/L   CO2 21 20 - 29 mmol/L   Calcium 9.6 8.7 - 10.2 mg/dL   Total Protein 6.9 6.0 - 8.5 g/dL   Albumin 4.6 3.8 - 4.9 g/dL   Globulin, Total 2.3 1.5 - 4.5 g/dL   Albumin/Globulin Ratio 2.0 1.2 - 2.2   Bilirubin Total 0.5 0.0 - 1.2 mg/dL   Alkaline Phosphatase 75 44 - 121 IU/L   AST 16 0 - 40 IU/L   ALT 22 0 - 44 IU/L      Assessment & Plan:   Problem List Items Addressed This Visit       Other   Shingles rash - Primary    Acute and ongoing rash noted for 48 hours.  Educated him on shingles.  Will start Valtrex 1000 MG TID for 10 days.  Recommend Tylenol as needed for pain, NO Ibuprofen (he has been taking this) due to his HTN.  Mild pain only at this time.  Return in one week as scheduled.        Relevant Medications   valACYclovir  (VALTREX) 1000 MG tablet     Follow up plan: Return for As scheduled 12/29/22.

## 2022-12-22 NOTE — Patient Instructions (Signed)

## 2022-12-22 NOTE — Assessment & Plan Note (Signed)
Acute and ongoing rash noted for 48 hours.  Educated him on shingles.  Will start Valtrex 1000 MG TID for 10 days.  Recommend Tylenol as needed for pain, NO Ibuprofen (he has been taking this) due to his HTN.  Mild pain only at this time.  Return in one week as scheduled.

## 2022-12-26 NOTE — Patient Instructions (Signed)

## 2022-12-27 ENCOUNTER — Other Ambulatory Visit: Payer: Self-pay | Admitting: Nurse Practitioner

## 2022-12-28 NOTE — Telephone Encounter (Signed)
Unable to refill per protocol, Rx requests are too soon. Both request were refilled for 90 days and 4 refills.  Requested Prescriptions  Pending Prescriptions Disp Refills   fenofibrate micronized (LOFIBRA) 134 MG capsule [Pharmacy Med Name: FENOFIBRATE 134 MG CAPSULE] 90 capsule 4    Sig: TAKE 1 CAPSULE BY MOUTH DAILY BEFORE BREAKFAST.     Cardiovascular:  Antilipid - Fibric Acid Derivatives Failed - 12/27/2022  1:29 AM      Failed - HGB in normal range and within 360 days    Hemoglobin  Date Value Ref Range Status  12/11/2021 15.3 13.0 - 17.7 g/dL Final         Failed - HCT in normal range and within 360 days    Hematocrit  Date Value Ref Range Status  12/11/2021 43.9 37.5 - 51.0 % Final         Failed - PLT in normal range and within 360 days    Platelets  Date Value Ref Range Status  12/11/2021 270 150 - 450 x10E3/uL Final         Failed - WBC in normal range and within 360 days    WBC  Date Value Ref Range Status  12/11/2021 8.3 3.4 - 10.8 x10E3/uL Final  08/21/2019 6.9 4.0 - 10.5 K/uL Final         Failed - Lipid Panel in normal range within the last 12 months    Cholesterol, Total  Date Value Ref Range Status  06/16/2022 174 100 - 199 mg/dL Final   LDL Chol Calc (NIH)  Date Value Ref Range Status  06/16/2022 97 0 - 99 mg/dL Final   HDL  Date Value Ref Range Status  06/16/2022 45 >39 mg/dL Final   Triglycerides  Date Value Ref Range Status  06/16/2022 188 (H) 0 - 149 mg/dL Final         Passed - ALT in normal range and within 360 days    ALT  Date Value Ref Range Status  06/16/2022 22 0 - 44 IU/L Final         Passed - AST in normal range and within 360 days    AST  Date Value Ref Range Status  06/16/2022 16 0 - 40 IU/L Final         Passed - Cr in normal range and within 360 days    Creatinine, Ser  Date Value Ref Range Status  06/16/2022 1.05 0.76 - 1.27 mg/dL Final         Passed - eGFR is 30 or above and within 360 days    GFR calc Af  Amer  Date Value Ref Range Status  08/21/2019 >60 >60 mL/min Final   GFR calc non Af Amer  Date Value Ref Range Status  08/21/2019 >60 >60 mL/min Final   eGFR  Date Value Ref Range Status  06/16/2022 83 >59 mL/min/1.73 Final         Passed - Valid encounter within last 12 months    Recent Outpatient Visits           6 days ago Herpes zoster without complication   [place], Henrine Screws T, NP   5 months ago Benign essential hypertension   [place], Henrine Screws T, NP   6 months ago Benign essential hypertension   [place], Henrine Screws T, NP   1 year ago Benign essential hypertension   [facility] [facility] Venita Lick, NP   1 year ago Encounter to establish care   [place], Barbaraann Faster, NP       Future Appointments             Tomorrow Venita Lick, NP [place]             atorvastatin (LIPITOR) 40 MG tablet [Pharmacy Med Name: ATORVASTATIN 40 MG TABLET] 90 tablet 4    Sig: TAKE 1 TABLET BY MOUTH EVERY DAY     Cardiovascular:  Antilipid - Statins Failed - 12/27/2022  1:29 AM      Failed - Lipid Panel in normal range within the last 12 months    Cholesterol, Total  Date Value Ref Range Status  06/16/2022 174 100 - 199 mg/dL Final   LDL Chol Calc (NIH)  Date Value Ref Range Status  06/16/2022 97 0 - 99 mg/dL Final   HDL  Date Value Ref Range Status  06/16/2022 45 >39 mg/dL Final   Triglycerides  Date Value Ref Range Status  06/16/2022 188 (H) 0 - 149 mg/dL Final         Passed - Patient is not pregnant      Passed - Valid encounter within last 12 months    Recent Outpatient Visits           6 days ago Herpes zoster without complication   [place], Henrine Screws T, NP   5 months ago Benign essential hypertension   [place], [place] T, NP   6 months ago Benign essential hypertension   [place], Henrine Screws T, NP   1 year ago Benign essential hypertension   [place], Henrine Screws T, NP   1 year ago Encounter to establish care   [place], Barbaraann Faster, NP       Future Appointments             Tomorrow Venita Lick, NP [place]

## 2022-12-29 ENCOUNTER — Encounter: Payer: Self-pay | Admitting: Nurse Practitioner

## 2022-12-29 ENCOUNTER — Ambulatory Visit (INDEPENDENT_AMBULATORY_CARE_PROVIDER_SITE_OTHER): Payer: BC Managed Care – PPO | Admitting: Nurse Practitioner

## 2022-12-29 VITALS — BP 142/86 | HR 88 | Temp 97.7°F | Ht 69.02 in | Wt 213.5 lb

## 2022-12-29 DIAGNOSIS — Z789 Other specified health status: Secondary | ICD-10-CM

## 2022-12-29 DIAGNOSIS — I10 Essential (primary) hypertension: Secondary | ICD-10-CM

## 2022-12-29 DIAGNOSIS — Z0000 Encounter for general adult medical examination without abnormal findings: Secondary | ICD-10-CM

## 2022-12-29 DIAGNOSIS — Z6831 Body mass index (BMI) 31.0-31.9, adult: Secondary | ICD-10-CM

## 2022-12-29 DIAGNOSIS — Z800 Family history of malignant neoplasm of digestive organs: Secondary | ICD-10-CM

## 2022-12-29 DIAGNOSIS — Z23 Encounter for immunization: Secondary | ICD-10-CM | POA: Diagnosis not present

## 2022-12-29 DIAGNOSIS — B029 Zoster without complications: Secondary | ICD-10-CM

## 2022-12-29 DIAGNOSIS — N400 Enlarged prostate without lower urinary tract symptoms: Secondary | ICD-10-CM

## 2022-12-29 DIAGNOSIS — E6609 Other obesity due to excess calories: Secondary | ICD-10-CM

## 2022-12-29 DIAGNOSIS — F419 Anxiety disorder, unspecified: Secondary | ICD-10-CM | POA: Diagnosis not present

## 2022-12-29 DIAGNOSIS — R7301 Impaired fasting glucose: Secondary | ICD-10-CM

## 2022-12-29 DIAGNOSIS — F17210 Nicotine dependence, cigarettes, uncomplicated: Secondary | ICD-10-CM

## 2022-12-29 DIAGNOSIS — E7800 Pure hypercholesterolemia, unspecified: Secondary | ICD-10-CM | POA: Diagnosis not present

## 2022-12-29 MED ORDER — LOSARTAN POTASSIUM-HCTZ 100-12.5 MG PO TABS: 1.0000 | ORAL_TABLET | Freq: Every day | ORAL | 4 refills | Status: DC

## 2022-12-29 MED ORDER — AMLODIPINE BESYLATE 10 MG PO TABS: 10.0000 mg | ORAL_TABLET | Freq: Every day | ORAL | 4 refills | Status: DC

## 2022-12-29 MED ORDER — SERTRALINE HCL 100 MG PO TABS: ORAL_TABLET | ORAL | 4 refills | Status: DC

## 2022-12-29 MED ORDER — BUSPIRONE HCL 10 MG PO TABS: 10.0000 mg | ORAL_TABLET | Freq: Every day | ORAL | 4 refills | Status: DC

## 2022-12-29 MED ORDER — FENOFIBRATE MICRONIZED 134 MG PO CAPS: 134.0000 mg | ORAL_CAPSULE | Freq: Every day | ORAL | 4 refills | Status: DC

## 2022-12-29 NOTE — Assessment & Plan Note (Signed)
Chronic, stable.  Denies SI/HI.  At this time continue current medication regimen and adjust as needed.  Refills up to date.  Severe anxiety in practice setting.

## 2022-12-29 NOTE — Assessment & Plan Note (Signed)
BMI 31.51.  Recommended eating smaller high protein, low fat meals more frequently and exercising 30 mins a day 5 times a week with a goal of 10-15lb weight loss in the next 3 months. Patient voiced their understanding and motivation to adhere to these recommendations.

## 2022-12-29 NOTE — Assessment & Plan Note (Signed)
I have recommended complete cessation of tobacco use. I have discussed various options available for assistance with tobacco cessation including over the counter methods (Nicotine gum, patch and lozenges). We also discussed prescription options (Chantix, Nicotine Inhaler / Nasal Spray). The patient is not interested in pursuing any prescription tobacco cessation options at this time.  Refuses lung cancer screening, highly recommended he obtain.

## 2022-12-29 NOTE — Assessment & Plan Note (Signed)
In father, patient is due for colonoscopy [id number]) and discussed with him -- he plans to obtain after shingles healed.  Lipase and Amylase check today.

## 2022-12-29 NOTE — Assessment & Plan Note (Signed)
Chronic, ongoing.  Continue current medication regimen and adjust as needed.  CMP and lipid panel today. 

## 2022-12-29 NOTE — Progress Notes (Signed)
BP (!) 142/86 (BP Location: Left Arm, Patient Position: Sitting, Cuff Size: Normal)   Pulse 88   Temp 97.7 F (36.5 C) (Oral)   Ht 5' 9.02" (1.753 m)   Wt 213 lb 8 oz (96.8 kg)   SpO2 98%   BMI 31.51 kg/m    Subjective:    Patient ID: Kevin Buckley, male    DOB: 05-01-1966, 57 y.o.   MRN: [id number]  HPI: Kevin Buckley is a 57 y.o. male presenting on 12/29/2022 for comprehensive medical examination. Current medical complaints include:none  He currently lives with: wife Interim Problems from his last visit: no  Recently treated for shingles one week ago with Valtrex -- he denies any further headaches or discomfort to left arm.  Does endorse fatigue since treatment.  HYPERTENSION  Continues on Losartan-HCTZ 100-12.5 MG daily and Amlodipine 5 MG daily.  Takes Atorvastatin and Lofibra for HLD.  Does drink alcohol, 4-5 beers a night -- is trying to reduce.  Is a smoker -- smokes 1 1/2 PPD, has been smoking since mid-20's on and off.  Does not wish to obtain lung cancer screening at this time.   Family history of pancreatic cancer. Hypertension status: stable  Satisfied with current treatment? yes Duration of hypertension: chronic BP monitoring frequency:   occasionally BP range: 130-135/90 BP medication side effects:  no Medication compliance: good compliance Aspirin: no Recurrent headaches: no Visual changes: no Palpitations: no Dyspnea: no Chest pain: no Lower extremity edema: no Dizzy/lightheaded: no  The 10-year ASCVD risk score (Arnett DK, et al., 2019) is: 14.3%   Values used to calculate the score:     Age: 8 years     Sex: Male     Is Non-Hispanic African American: No     Diabetic: No     Tobacco smoker: Yes     Systolic Blood Pressure: [id number] mmHg     Is BP treated: Yes     HDL Cholesterol: 45 mg/dL     Total Cholesterol: 174 mg/dL   DEPRESSION Continues on Sertraline and Buspar. Mood status: stable Satisfied with current treatment?: yes Symptom severity:  moderate  Duration of current treatment : chronic Side effects: no Medication compliance: good compliance Psychotherapy/counseling: no Depressed mood: no Anxious mood: no Anhedonia: no Significant weight loss or gain: no Insomnia: no Fatigue: no Feelings of worthlessness or guilt: no Impaired concentration/indecisiveness: no Suicidal ideations: no Hopelessness: no Crying spells: no    12/29/2022    1:43 PM 12/22/2022    3:51 PM 07/28/2022    2:31 PM 06/16/2022    2:29 PM 12/11/2021    2:16 PM  Depression screen PHQ 2/9  Decreased Interest 0 0 0 0 0  Down, Depressed, Hopeless 0 0 0 0 0  PHQ - 2 Score 0 0 0 0 0  Altered sleeping 0 0 0 0 0  Tired, decreased energy 0 0 0 1 0  Change in appetite 0 0 0 0 0  Feeling bad or failure about yourself  0 0 0 0 0  Trouble concentrating 0 0 0 0 0  Moving slowly or fidgety/restless 0 0 0 0 0  Suicidal thoughts 0 0 0 0 0  PHQ-9 Score 0 0 0 1 0  Difficult doing work/chores Not difficult at all Not difficult at all Not difficult at all Not difficult at all        12/29/2022    1:44 PM 07/28/2022    2:31 PM 06/16/2022  2:29 PM 12/11/2021    2:16 PM  GAD 7 : Generalized Anxiety Score  Nervous, Anxious, on Edge 0 1 0 1  Control/stop worrying 0 0 0 0  Worry too much - different things 0 0 0 0  Trouble relaxing 0 0 0 0  Restless 0 0 0 0  Easily annoyed or irritable 0 0 0 0  Afraid - awful might happen 0 0 0 0  Total GAD 7 Score 0 1 0 1  Anxiety Difficulty Not difficult at all Not difficult at all Not difficult at all Not difficult at all      Functional Status Survey: Is the patient deaf or have difficulty hearing?: No Does the patient have difficulty seeing, even when wearing glasses/contacts?: No Does the patient have difficulty concentrating, remembering, or making decisions?: No Does the patient have difficulty walking or climbing stairs?: No Does the patient have difficulty dressing or bathing?: No Does the patient have difficulty  doing errands alone such as visiting a doctor's office or shopping?: No     12/11/2021    2:53 PM 06/16/2022    2:29 PM 07/28/2022    2:30 PM 12/22/2022    3:51 PM 12/29/2022    1:43 PM  [place] in the past year? 0 0 0 0 0  Was there an injury with Fall? 0 0 0 0 0  Fall Risk Category Calculator 0 0 0 0 0  Fall Risk Category (Retired) Low Low Low    (RETIRED) Patient Fall Risk Level Low fall risk  Low fall risk    Patient at Risk for Falls Due to No Fall Risks No Fall Risks No Fall Risks No Fall Risks No Fall Risks  Fall risk Follow up Falls prevention discussed Falls evaluation completed Falls evaluation completed Falls evaluation completed Falls evaluation completed    Advanced Directives Does patient have a HCPOA?    no If yes, name and contact information:  Does patient have a living will or MOST form?  no  Past Medical History:  Past Medical History:  Diagnosis Date   Anxiety    Elevated cholesterol    Hypertension     Surgical History:  History reviewed. No pertinent surgical history.  Medications:  Current Outpatient Medications on File Prior to Visit  Medication Sig   atorvastatin (LIPITOR) 40 MG tablet Take 1 tablet (40 mg total) by mouth daily.   valACYclovir (VALTREX) 1000 MG tablet Take 1 tablet (1,000 mg total) by mouth 3 (three) times daily for 10 days.   No current facility-administered medications on file prior to visit.    Allergies:  No Known Allergies  Social History:  Social History   Socioeconomic History   Marital status: Married    Spouse name: Not on file   Number of children: 1   Years of education: Not on file   Highest education level: Not on file  Occupational History   Not on file  Tobacco Use   Smoking status: Every Day    Types: Cigarettes   Smokeless tobacco: Never  Substance and Sexual Activity   Alcohol use: Yes    Alcohol/week: 38.0 standard drinks of alcohol    Types: 38 Cans of beer per week   Drug use: No    Sexual activity: Yes  Other Topics Concern   Not on file  Social History Narrative   Not on file   Social Determinants of Health   Financial Resource Strain: Low Risk  (09/08/2021)  Overall Financial Resource Strain (CARDIA)    Difficulty of Paying Living Expenses: Not hard at all  Food Insecurity: No Food Insecurity (09/08/2021)   Hunger Vital Sign    Worried About Running Out of Food in the Last Year: Never true    Ran Out of Food in the Last Year: Never true  Transportation Needs: No Transportation Needs (09/08/2021)   PRAPARE - Hydrologist (Medical): No    Lack of Transportation (Non-Medical): No  Physical Activity: Insufficiently Active (09/08/2021)   Exercise Vital Sign    Days of Exercise per Week: 3 days    Minutes of Exercise per Session: 30 min  Stress: No Stress Concern Present (09/08/2021)   [place]    Feeling of Stress : Not at all  Social Connections: Moderately Isolated (09/08/2021)   Social Connection and Isolation Panel [NHANES]    Frequency of Communication with Friends and Family: Three times a week    Frequency of Social Gatherings with Friends and Family: Three times a week    Attends Religious Services: Never    Active Member of Clubs or Organizations: No    Attends Archivist Meetings: Never    Marital Status: Married  Human resources officer Violence: Not At Risk (09/08/2021)   Humiliation, Afraid, Rape, and Kick questionnaire    Fear of Current or Ex-Partner: No    Emotionally Abused: No    Physically Abused: No    Sexually Abused: No   Social History   Tobacco Use  Smoking Status Every Day   Types: Cigarettes  Smokeless Tobacco Never   Social History   Substance and Sexual Activity  Alcohol Use Yes   Alcohol/week: 38.0 standard drinks of alcohol   Types: 38 Cans of beer per week    Family History:  Family History  Problem Relation Age of  Onset   Multiple sclerosis Mother    Pancreatic cancer Father    Hypertension Paternal Grandmother     Past medical history, surgical history, medications, allergies, family history and social history reviewed with patient today and changes made to appropriate areas of the chart.   ROS All other ROS negative except what is listed above and in the HPI.      Objective:    BP (!) 142/86 (BP Location: Left Arm, Patient Position: Sitting, Cuff Size: Normal)   Pulse 88   Temp 97.7 F (36.5 C) (Oral)   Ht 5' 9.02" (1.753 m)   Wt 213 lb 8 oz (96.8 kg)   SpO2 98%   BMI 31.51 kg/m   Wt Readings from Last 3 Encounters:  12/29/22 213 lb 8 oz (96.8 kg)  12/22/22 212 lb 8 oz (96.4 kg)  07/28/22 212 lb 9.6 oz (96.4 kg)    Physical Exam Vitals and nursing note reviewed.  Constitutional:      General: He is awake. He is not in acute distress.    Appearance: He is well-developed and well-groomed. He is not ill-appearing or toxic-appearing.  HENT:     Head: Normocephalic and atraumatic.     Right Ear: Hearing, tympanic membrane, ear canal and external ear normal. No drainage.     Left Ear: Hearing, tympanic membrane, ear canal and external ear normal. No drainage.     Nose: Nose normal.     Mouth/Throat:     Pharynx: Uvula midline.  Eyes:     General: Lids are normal.  Right eye: No discharge.        Left eye: No discharge.     Extraocular Movements: Extraocular movements intact.     Conjunctiva/sclera: Conjunctivae normal.     Pupils: Pupils are equal, round, and reactive to light.     Visual Fields: Right eye visual fields normal and left eye visual fields normal.  Neck:     Thyroid: No thyromegaly.     Vascular: No carotid bruit or JVD.     Trachea: Trachea normal.  Cardiovascular:     Rate and Rhythm: Normal rate and regular rhythm.     Heart sounds: Normal heart sounds, S1 normal and S2 normal. No murmur heard.    No gallop.  Pulmonary:     Effort: Pulmonary effort is  normal. No accessory muscle usage or respiratory distress.     Breath sounds: Normal breath sounds.  Abdominal:     General: Bowel sounds are normal.     Palpations: Abdomen is soft. There is no hepatomegaly or splenomegaly.     Tenderness: There is no abdominal tenderness.  Musculoskeletal:        General: Normal range of motion.     Cervical back: Normal range of motion and neck supple.     Right lower leg: No edema.     Left lower leg: No edema.  Lymphadenopathy:     Head:     Right side of head: No submental, submandibular, tonsillar, preauricular or posterior auricular adenopathy.     Left side of head: No submental, submandibular, tonsillar, preauricular or posterior auricular adenopathy.     Cervical: No cervical adenopathy.  Skin:    General: Skin is warm and dry.     Capillary Refill: Capillary refill takes less than 2 seconds.     Findings: Rash present.     Comments: Shingles rash extending from upper left chest to medial left arm is overall healing with no further blisters.  Crusting present to areas.  Neurological:     Mental Status: He is alert and oriented to person, place, and time.     Gait: Gait is intact.     Deep Tendon Reflexes: Reflexes are normal and symmetric.     Reflex Scores:      Brachioradialis reflexes are 2+ on the right side and 2+ on the left side.      Patellar reflexes are 2+ on the right side and 2+ on the left side. Psychiatric:        Attention and Perception: Attention normal.        Mood and Affect: Mood normal.        Speech: Speech normal.        Behavior: Behavior normal. Behavior is cooperative.        Thought Content: Thought content normal.        Cognition and Memory: Cognition normal.     Results for orders placed or performed in visit on 06/16/22  Lipid Panel w/o Chol/HDL Ratio  Result Value Ref Range   Cholesterol, Total 174 100 - 199 mg/dL   Triglycerides 188 (H) 0 - 149 mg/dL   HDL 45 >39 mg/dL   VLDL Cholesterol Cal 32 5  - 40 mg/dL   LDL Chol Calc (NIH) 97 0 - 99 mg/dL  Comprehensive metabolic panel  Result Value Ref Range   Glucose 90 70 - 99 mg/dL   BUN 12 6 - 24 mg/dL   Creatinine, Ser 1.05 0.76 - 1.27 mg/dL  eGFR 83 >59 mL/min/1.73   BUN/Creatinine Ratio 11 9 - 20   Sodium 140 134 - 144 mmol/L   Potassium 4.3 3.5 - 5.2 mmol/L   Chloride 102 96 - 106 mmol/L   CO2 21 20 - 29 mmol/L   Calcium 9.6 8.7 - 10.2 mg/dL   Total Protein 6.9 6.0 - 8.5 g/dL   Albumin 4.6 3.8 - 4.9 g/dL   Globulin, Total 2.3 1.5 - 4.5 g/dL   Albumin/Globulin Ratio 2.0 1.2 - 2.2   Bilirubin Total 0.5 0.0 - 1.2 mg/dL   Alkaline Phosphatase 75 44 - 121 IU/L   AST 16 0 - 40 IU/L   ALT 22 0 - 44 IU/L      Assessment & Plan:   Problem List Items Addressed This Visit       Cardiovascular and Mediastinum   Benign essential hypertension - Primary    Chronic, ongoing.  Remains above goal, suspect some related to alcohol use.  Continue to recommend reduction.  Recommend he monitor BP at least a few mornings a week at home and document.  DASH diet at home.  Continue current medication regimen, with exception of increasing Amlodipine to 10 MG daily.  Labs: CBC, CMP, TSH, urine ALB.  Recommend complete cessation of alcohol use and smoking.  Return in 4 weeks.       Relevant Medications   amLODipine (NORVASC) 10 MG tablet   losartan-hydrochlorothiazide (HYZAAR) 100-12.5 MG tablet   fenofibrate micronized (LOFIBRA) 134 MG capsule   Other Relevant Orders   CBC with Differential/Platelet   Comprehensive metabolic panel   TSH     Endocrine   IFG (impaired fasting glucose)    A1c 5.3% last check, discussed with patient.  Stable.  Recheck annually.      Relevant Orders   Urine Microalbumin w/creat. ratio   HgB A1c     Other   Alcohol use    A few beers a night with AUDIT 11 last check -- recommend he cut back on alcohol use which would benefit blood pressure and overall health.  CMP today.        Relevant Orders   Urine  Microalbumin w/creat. ratio   Anxiety    Chronic, stable.  Denies SI/HI.  At this time continue current medication regimen and adjust as needed.  Refills up to date.  Severe anxiety in practice setting.      Relevant Medications   sertraline (ZOLOFT) 100 MG tablet   busPIRone (BUSPAR) 10 MG tablet   Family history of pancreatic cancer    In father, patient is due for colonoscopy [id number]) and discussed with him -- he plans to obtain after shingles healed.  Lipase and Amylase check today.      Relevant Orders   Amylase   Lipase   Nicotine dependence, cigarettes, uncomplicated    I have recommended complete cessation of tobacco use. I have discussed various options available for assistance with tobacco cessation including over the counter methods (Nicotine gum, patch and lozenges). We also discussed prescription options (Chantix, Nicotine Inhaler / Nasal Spray). The patient is not interested in pursuing any prescription tobacco cessation options at this time.  Refuses lung cancer screening, highly recommended he obtain.        Obesity    BMI 31.51.  Recommended eating smaller high protein, low fat meals more frequently and exercising 30 mins a day 5 times a week with a goal of 10-15lb weight loss in the next 3 months. Patient  voiced their understanding and motivation to adhere to these recommendations.       Pure hypercholesterolemia    Chronic, ongoing.  Continue current medication regimen and adjust as needed.  CMP and lipid panel today.         Relevant Medications   amLODipine (NORVASC) 10 MG tablet   losartan-hydrochlorothiazide (HYZAAR) 100-12.5 MG tablet   fenofibrate micronized (LOFIBRA) 134 MG capsule   Other Relevant Orders   Comprehensive metabolic panel   Lipid Panel w/o Chol/HDL Ratio   Shingles rash    Acute and overall improving.  Recommend complete full Valtrex treatment.  Rash is healing.      Other Visit Diagnoses     Benign prostatic hyperplasia without lower  urinary tract symptoms       PSA on labs today.   Relevant Orders   PSA   Need for Td vaccine       Td vaccine in office today and discussed with patient.   Encounter for annual physical exam       Annual physical today with labs and health maintenance reviewed, discussed with patient.       Discussed aspirin prophylaxis for myocardial infarction prevention and decision was it was not indicated  LABORATORY TESTING:  Health maintenance labs ordered today as discussed above.   The natural history of prostate cancer and ongoing controversy regarding screening and potential treatment outcomes of prostate cancer has been discussed with the patient. The meaning of a false positive PSA and a false negative PSA has been discussed. He indicates understanding of the limitations of this screening test and wishes to proceed with screening PSA testing.  IMMUNIZATIONS:   - Tdap: Tetanus vaccination status reviewed: Refused. - Influenza: Refused - Pneumovax: Refused - Prevnar: Not applicable - Zostavax vaccine:  will get in upcoming months  SCREENING: - Colonoscopy: Refused, wishes to get over shingles 1st  Discussed with patient purpose of the colonoscopy is to detect colon cancer at curable precancerous or early stages   - AAA Screening: Not applicable  -Hearing Test: Not applicable  -Spirometry: Not applicable   PATIENT COUNSELING:    Sexuality: Discussed sexually transmitted diseases, partner selection, use of condoms, avoidance of unintended pregnancy  and contraceptive alternatives.   Advised to avoid cigarette smoking.  I discussed with the patient that most people either abstain from alcohol or drink within safe limits (<=14/week and <=4 drinks/occasion for males, <=7/weeks and <= 3 drinks/occasion for females) and that the risk for alcohol disorders and other health effects rises proportionally with the number of drinks per week and how often a drinker exceeds daily  limits.  Discussed cessation/primary prevention of drug use and availability of treatment for abuse.   Diet: Encouraged to adjust caloric intake to maintain  or achieve ideal body weight, to reduce intake of dietary saturated fat and total fat, to limit sodium intake by avoiding high sodium foods and not adding table salt, and to maintain adequate dietary potassium and calcium preferably from fresh fruits, vegetables, and low-fat dairy products.    Stressed the importance of regular exercise  Injury prevention: Discussed safety belts, safety helmets, smoke detector, smoking near bedding or upholstery.   Dental health: Discussed importance of regular tooth brushing, flossing, and dental visits.   Follow up plan: NEXT PREVENTATIVE PHYSICAL DUE IN 1 YEAR. Return in about 4 weeks (around 01/26/2023) for HTN -- Amlodipine 10 MG increase.

## 2022-12-29 NOTE — Assessment & Plan Note (Signed)
A1c 5.3% last check, discussed with patient.  Stable.  Recheck annually.

## 2022-12-29 NOTE — Assessment & Plan Note (Signed)
Acute and overall improving.  Recommend complete full Valtrex treatment.  Rash is healing.

## 2022-12-29 NOTE — Assessment & Plan Note (Signed)
Chronic, ongoing.  Remains above goal, suspect some related to alcohol use.  Continue to recommend reduction.  Recommend he monitor BP at least a few mornings a week at home and document.  DASH diet at home.  Continue current medication regimen, with exception of increasing Amlodipine to 10 MG daily.  Labs: CBC, CMP, TSH, urine ALB.  Recommend complete cessation of alcohol use and smoking.  Return in 4 weeks.

## 2022-12-29 NOTE — Assessment & Plan Note (Addendum)
A few beers a night with AUDIT 11 last check -- recommend he cut back on alcohol use which would benefit blood pressure and overall health.  CMP today.

## 2022-12-30 ENCOUNTER — Other Ambulatory Visit: Payer: Self-pay | Admitting: Nurse Practitioner

## 2022-12-30 LAB — CBC WITH DIFFERENTIAL/PLATELET
Basophils Absolute: 0.1 10*3/uL (ref 0.0–0.2)
Basos: 1 %
EOS (ABSOLUTE): 0.2 10*3/uL (ref 0.0–0.4)
Eos: 2 %
Hematocrit: 44.4 % (ref 37.5–51.0)
Hemoglobin: 15.6 g/dL (ref 13.0–17.7)
Immature Grans (Abs): 0 10*3/uL (ref 0.0–0.1)
Immature Granulocytes: 0 %
Lymphocytes Absolute: 3.4 10*3/uL — ABNORMAL HIGH (ref 0.7–3.1)
Lymphs: 37 %
MCH: 32.2 pg (ref 26.6–33.0)
MCHC: 35.1 g/dL (ref 31.5–35.7)
MCV: 92 fL (ref 79–97)
Monocytes Absolute: 0.5 10*3/uL (ref 0.1–0.9)
Monocytes: 6 %
Neutrophils Absolute: 4.9 10*3/uL (ref 1.4–7.0)
Neutrophils: 54 %
Platelets: 265 10*3/uL (ref 150–450)
RBC: 4.85 x10E6/uL (ref 4.14–5.80)
RDW: 12.7 % (ref 11.6–15.4)
WBC: 9.1 10*3/uL (ref 3.4–10.8)

## 2022-12-30 LAB — COMPREHENSIVE METABOLIC PANEL
ALT: 36 IU/L (ref 0–44)
AST: 22 IU/L (ref 0–40)
Albumin/Globulin Ratio: 2.4 — ABNORMAL HIGH (ref 1.2–2.2)
Albumin: 4.8 g/dL (ref 3.8–4.9)
Alkaline Phosphatase: 74 IU/L (ref 44–121)
BUN/Creatinine Ratio: 14 (ref 9–20)
BUN: 13 mg/dL (ref 6–24)
Bilirubin Total: 0.6 mg/dL (ref 0.0–1.2)
CO2: 21 mmol/L (ref 20–29)
Calcium: 9.5 mg/dL (ref 8.7–10.2)
Chloride: 97 mmol/L (ref 96–106)
Creatinine, Ser: 0.94 mg/dL (ref 0.76–1.27)
Globulin, Total: 2 g/dL (ref 1.5–4.5)
Glucose: 86 mg/dL (ref 70–99)
Potassium: 4 mmol/L (ref 3.5–5.2)
Sodium: 136 mmol/L (ref 134–144)
Total Protein: 6.8 g/dL (ref 6.0–8.5)
eGFR: 95 mL/min/{1.73_m2} (ref >59)

## 2022-12-30 LAB — TSH: TSH: 2.7 u[IU]/mL (ref 0.450–4.500)

## 2022-12-30 LAB — PSA: Prostate Specific Ag, Serum: 0.6 ng/mL (ref 0.0–4.0)

## 2022-12-30 LAB — LIPID PANEL W/O CHOL/HDL RATIO
Cholesterol, Total: 170 mg/dL (ref 100–199)
HDL: 37 mg/dL — ABNORMAL LOW (ref >39)
LDL Chol Calc (NIH): 88 mg/dL (ref 0–99)
Triglycerides: 272 mg/dL — ABNORMAL HIGH (ref 0–149)
VLDL Cholesterol Cal: 45 mg/dL — ABNORMAL HIGH (ref 5–40)

## 2022-12-30 LAB — HEMOGLOBIN A1C
Est. average glucose Bld gHb Est-mCnc: 114 mg/dL
Hgb A1c MFr Bld: 5.6 % (ref 4.8–5.6)

## 2022-12-30 LAB — LIPASE: Lipase: 41 U/L (ref 13–78)

## 2022-12-30 LAB — AMYLASE: Amylase: 36 U/L (ref 31–110)

## 2022-12-30 MED ORDER — ATORVASTATIN CALCIUM 80 MG PO TABS: 80.0000 mg | ORAL_TABLET | Freq: Every day | ORAL | 3 refills | Status: DC

## 2022-12-30 NOTE — Progress Notes (Signed)
Contacted via MyChart   Good evening Kevin Buckley, your labs have returned: - CBC is overall stable -- no elevation in WBC and no anemia. - Kidney function, creatinine and eGFR, remains normal, as is liver function, AST and ALT. However, please focus on reducing alcohol use. - Cholesterol levels are improving but not at goal.  I would like to increase your Atorvastatin to 80 MG -- I will send this dose change in and you can finish 40 MG tablets by taking two a day until gone.  Will recheck in March, come fasting. - Remainder of your blood work is normal!!  [facility]!!  Any questions? Keep being stellar!!  Thank you for allowing me to participate in your care.  I appreciate you. Kindest regards, Gionna Polak

## 2022-12-31 LAB — MICROALBUMIN / CREATININE URINE RATIO
Creatinine, Urine: 44.6 mg/dL
Microalb/Creat Ratio: <7 mg/g creat (ref 0–29)
Microalbumin, Urine: <3 ug/mL

## 2023-01-17 ENCOUNTER — Other Ambulatory Visit: Payer: Self-pay | Admitting: Nurse Practitioner

## 2023-01-18 NOTE — Telephone Encounter (Signed)
No longer current dosing of this medication Requested Prescriptions  Pending Prescriptions Disp Refills   atorvastatin (LIPITOR) 40 MG tablet [Pharmacy Med Name: ATORVASTATIN 40 MG TABLET] 90 tablet 4    Sig: TAKE 1 TABLET BY MOUTH EVERY DAY     Cardiovascular:  Antilipid - Statins Failed - 01/17/2023  9:31 AM      Failed - Lipid Panel in normal range within the last 12 months    Cholesterol, Total  Date Value Ref Range Status  12/29/2022 170 100 - 199 mg/dL Final   LDL Chol Calc (NIH)  Date Value Ref Range Status  12/29/2022 88 0 - 99 mg/dL Final   HDL  Date Value Ref Range Status  12/29/2022 37 (L) >39 mg/dL Final   Triglycerides  Date Value Ref Range Status  12/29/2022 272 (H) 0 - 149 mg/dL Final         Passed - Patient is not pregnant      Passed - Valid encounter within last 12 months    Recent Outpatient Visits           2 weeks ago Benign essential hypertension   [place], [place] T, NP   3 weeks ago Herpes zoster without complication   [place], Alford T, NP   5 months ago Benign essential hypertension   [place], [place] T, NP   7 months ago Benign essential hypertension   [place], Henrine Screws T, NP   1 year ago Benign essential hypertension   [place], Barbaraann Faster, NP       Future Appointments             In 1 week [place], Barbaraann Faster, NP [place], [facility]

## 2023-01-29 NOTE — Patient Instructions (Signed)
Be Involved in Your Health Care:  Taking Medications When medications are taken as directed, they can greatly improve your health. But if they are not taken as instructed, they may not work. In some cases, not taking them correctly can be harmful. To help ensure your treatment remains effective and safe, understand your medications and how to take them.  Your lab results, notes and after visit summary will be available on My Chart. We strongly encourage you to use this feature. If lab results are abnormal the clinic will contact you with the appropriate steps. If the clinic does not contact you assume the results are satisfactory. You can always see your results on My Chart. If you have questions regarding your condition, please contact the clinic during office hours. You can also ask questions on My Chart.  We at [facility] are grateful that you chose us to provide care. We strive to provide excellent and compassionate care and are always looking for feedback. If you get a survey from the clinic please complete this.   DASH Eating Plan DASH stands for Dietary Approaches to Stop Hypertension. The DASH eating plan is a healthy eating plan that has been shown to: Reduce high blood pressure (hypertension). Reduce your risk for type 2 diabetes, heart disease, and stroke. Help with weight loss. What are tips for following this plan? Reading food labels Check food labels for the amount of salt (sodium) per serving. Choose foods with less than 5 percent of the Daily Value of sodium. Generally, foods with less than 300 milligrams (mg) of sodium per serving fit into this eating plan. To find whole grains, look for the word "whole" as the first word in the ingredient list. Shopping Buy products labeled as "low-sodium" or "no salt added." Buy fresh foods. Avoid canned foods and pre-made or frozen meals. Cooking Avoid adding salt when cooking. Use salt-free seasonings or herbs instead of  table salt or sea salt. Check with your health care provider or pharmacist before using salt substitutes. Do not fry foods. Cook foods using healthy methods such as baking, boiling, grilling, roasting, and broiling instead. Cook with heart-healthy oils, such as olive, canola, avocado, soybean, or sunflower oil. Meal planning  Eat a balanced diet that includes: 4 or more servings of fruits and 4 or more servings of vegetables each day. Try to fill one-half of your plate with fruits and vegetables. 6-8 servings of whole grains each day. Less than 6 oz (170 g) of lean meat, poultry, or fish each day. A 3-oz (85-g) serving of meat is about the same size as a deck of cards. One egg equals 1 oz (28 g). 2-3 servings of low-fat dairy each day. One serving is 1 cup (237 mL). 1 serving of nuts, seeds, or beans 5 times each week. 2-3 servings of heart-healthy fats. Healthy fats called omega-3 fatty acids are found in foods such as walnuts, flaxseeds, fortified milks, and eggs. These fats are also found in cold-water fish, such as sardines, salmon, and mackerel. Limit how much you eat of: Canned or prepackaged foods. Food that is high in trans fat, such as some fried foods. Food that is high in saturated fat, such as fatty meat. Desserts and other sweets, sugary drinks, and other foods with added sugar. Full-fat dairy products. Do not salt foods before eating. Do not eat more than 4 egg yolks a week. Try to eat at least 2 vegetarian meals a week. Eat more home-cooked food and less restaurant,   buffet, and fast food. Lifestyle When eating at a restaurant, ask that your food be prepared with less salt or no salt, if possible. If you drink alcohol: Limit how much you use to: 0-1 drink a day for women who are not pregnant. 0-2 drinks a day for men. Be aware of how much alcohol is in your drink. In the [place] one drink equals one 12 oz bottle of beer (355 mL), one 5 oz glass of wine (148 mL), or one 1 oz  glass of hard liquor (44 mL). General information Avoid eating more than 2,300 mg of salt a day. If you have hypertension, you may need to reduce your sodium intake to 1,500 mg a day. Work with your health care provider to maintain a healthy body weight or to lose weight. Ask what an ideal weight is for you. Get at least 30 minutes of exercise that causes your heart to beat faster (aerobic exercise) most days of the week. Activities may include walking, swimming, or biking. Work with your health care provider or dietitian to adjust your eating plan to your individual calorie needs. What foods should I eat? Fruits All fresh, dried, or frozen fruit. Canned fruit in natural juice (without added sugar). Vegetables Fresh or frozen vegetables (raw, steamed, roasted, or grilled). Low-sodium or reduced-sodium tomato and vegetable juice. Low-sodium or reduced-sodium tomato sauce and tomato paste. Low-sodium or reduced-sodium canned vegetables. Grains Whole-grain or whole-wheat bread. Whole-grain or whole-wheat pasta. Brown rice. Oatmeal. Quinoa. Bulgur. Whole-grain and low-sodium cereals. Pita bread. Low-fat, low-sodium crackers. Whole-wheat flour tortillas. Meats and other proteins Skinless chicken or turkey. Ground chicken or turkey. Pork with fat trimmed off. Fish and seafood. Egg whites. Dried beans, peas, or lentils. Unsalted nuts, nut butters, and seeds. Unsalted canned beans. Lean cuts of beef with fat trimmed off. Low-sodium, lean precooked or cured meat, such as sausages or meat loaves. Dairy Low-fat (1%) or fat-free (skim) milk. Reduced-fat, low-fat, or fat-free cheeses. Nonfat, low-sodium ricotta or cottage cheese. Low-fat or nonfat yogurt. Low-fat, low-sodium cheese. Fats and oils Soft margarine without trans fats. Vegetable oil. Reduced-fat, low-fat, or light mayonnaise and salad dressings (reduced-sodium). Canola, safflower, olive, avocado, soybean, and sunflower oils. Avocado. Seasonings and  condiments Herbs. Spices. Seasoning mixes without salt. Other foods Unsalted popcorn and pretzels. Fat-free sweets. The items listed above may not be a complete list of foods and beverages you can eat. Contact a dietitian for more information. What foods should I avoid? Fruits Canned fruit in a light or heavy syrup. Fried fruit. Fruit in cream or butter sauce. Vegetables Creamed or fried vegetables. Vegetables in a cheese sauce. Regular canned vegetables (not low-sodium or reduced-sodium). Regular canned tomato sauce and paste (not low-sodium or reduced-sodium). Regular tomato and vegetable juice (not low-sodium or reduced-sodium). Pickles. Olives. Grains Baked goods made with fat, such as croissants, muffins, or some breads. Dry pasta or rice meal packs. Meats and other proteins Fatty cuts of meat. Ribs. Fried meat. Bacon. Bologna, salami, and other precooked or cured meats, such as sausages or meat loaves. Fat from the back of a pig (fatback). Bratwurst. Salted nuts and seeds. Canned beans with added salt. Canned or smoked fish. Whole eggs or egg yolks. Chicken or turkey with skin. Dairy Whole or 2% milk, cream, and half-and-half. Whole or full-fat cream cheese. Whole-fat or sweetened yogurt. Full-fat cheese. Nondairy creamers. Whipped toppings. Processed cheese and cheese spreads. Fats and oils Butter. Stick margarine. Lard. Shortening. Ghee. Bacon fat. Tropical oils, such as coconut, palm   kernel, or palm oil. Seasonings and condiments Onion salt, garlic salt, seasoned salt, table salt, and sea salt. Worcestershire sauce. Tartar sauce. Barbecue sauce. Teriyaki sauce. Soy sauce, including reduced-sodium. Steak sauce. Canned and packaged gravies. Fish sauce. Oyster sauce. Cocktail sauce. Store-bought horseradish. Ketchup. Mustard. Meat flavorings and tenderizers. Bouillon cubes. Hot sauces. Pre-made or packaged marinades. Pre-made or packaged taco seasonings. Relishes. Regular salad  dressings. Other foods Salted popcorn and pretzels. The items listed above may not be a complete list of foods and beverages you should avoid. Contact a dietitian for more information. Where to find more information National Heart, Lung, and Blood Institute: www.nhlbi.nih.gov American Heart Association: www.heart.org Academy of Nutrition and Dietetics: www.eatright.org National Kidney Foundation: www.kidney.org Summary The DASH eating plan is a healthy eating plan that has been shown to reduce high blood pressure (hypertension). It may also reduce your risk for type 2 diabetes, heart disease, and stroke. When on the DASH eating plan, aim to eat more fresh fruits and vegetables, whole grains, lean proteins, low-fat dairy, and heart-healthy fats. With the DASH eating plan, you should limit salt (sodium) intake to 2,300 mg a day. If you have hypertension, you may need to reduce your sodium intake to 1,500 mg a day. Work with your health care provider or dietitian to adjust your eating plan to your individual calorie needs. This information is not intended to replace advice given to you by your health care provider. Make sure you discuss any questions you have with your health care provider. Document Revised: 09/28/2019 Document Reviewed: 09/28/2019 Elsevier Patient Education  2023 Elsevier Inc.  

## 2023-01-31 ENCOUNTER — Ambulatory Visit (INDEPENDENT_AMBULATORY_CARE_PROVIDER_SITE_OTHER): Payer: BC Managed Care – PPO | Admitting: Nurse Practitioner

## 2023-01-31 VITALS — BP 132/80 | HR 90 | Temp 97.8°F | Ht 69.02 in | Wt 212.7 lb

## 2023-01-31 DIAGNOSIS — I10 Essential (primary) hypertension: Secondary | ICD-10-CM | POA: Diagnosis not present

## 2023-01-31 DIAGNOSIS — L03011 Cellulitis of right finger: Secondary | ICD-10-CM | POA: Diagnosis not present

## 2023-01-31 MED ORDER — SULFAMETHOXAZOLE-TRIMETHOPRIM 800-160 MG PO TABS: 1.0000 | ORAL_TABLET | Freq: Two times a day (BID) | ORAL | 0 refills | Status: AC

## 2023-01-31 MED ORDER — MUPIROCIN 2 % EX OINT: 1.0000 | TOPICAL_OINTMENT | Freq: Two times a day (BID) | CUTANEOUS | 0 refills | Status: DC

## 2023-01-31 NOTE — Assessment & Plan Note (Signed)
Chronic, improving.  BP trending down towards goal.  Continue to recommend reduction of alcohol use as suspect this drives BP up.  Recommend he monitor BP at least a few mornings a week at home and document.  DASH diet at home.  Continue current medication regimen and adjust as needed.  Labs: BMP.  Recommend complete cessation of alcohol use and smoking.

## 2023-01-31 NOTE — Assessment & Plan Note (Signed)
Acute after self draining of wound, suspect introduced bacteria with self care.  At this time start Bactrim BID for 7 days and Mupirocin.  Recommend he apply warm compresses 4 times a day for 15 minutes to area.  Do not drain area, allow to self heal.  Return in 2 weeks.

## 2023-01-31 NOTE — Progress Notes (Signed)
BP 132/80 (BP Location: Left Arm, Patient Position: Sitting, Cuff Size: Normal)   Pulse 90   Temp 97.8 F (36.6 C) (Oral)   Ht 5' 9.02" (1.753 m)   Wt 212 lb 11.2 oz (96.5 kg)   SpO2 97%   BMI 31.40 kg/m    Subjective:    Patient ID: Kevin Buckley, male    DOB: [birth year], 57 y.o.   MRN: [id number]  HPI: Kevin Buckley is a 57 y.o. male  Chief Complaint  Patient presents with   Hypertension    Increased amlodipine to 10 mg   Hand Pain    Was trimming bushes and got a thorn in his finger at the nail base and is now red and swollen. Happened about a week ago   HYPERTENSION  Increased Amlodipine to 10 MG daily at visit on 12/29/22.  He is tolerating increase without ADR.  Continues on Losartan-HCTZ 100-12.5 MG.  Does drink alcohol, 4 beers a night -- is trying to reduce. Is a smoker -- smokes 1 1/2 PPD, has been smoking since mid-20's on and off.  Does not wish to obtain lung cancer screening at this time.  Hypertension status: stable  Satisfied with current treatment? yes Duration of hypertension: chronic BP monitoring frequency:  not checking BP range:  BP medication side effects:  no Medication compliance: good compliance Aspirin: no Recurrent headaches: no Visual changes: no Palpitations: no Dyspnea: no Chest pain: no Lower extremity edema: no Dizzy/lightheaded: no   SKIN INFECTION Paronychia to right middle finger, was trimming bushes and a splinter got under nail and he got it out with a pin and poking at area, but area now infected. Duration: weeks Location: right middle finger History of trauma in area: no Pain: yes Quality: yes Severity: 6/10 Redness: yes Swelling: yes Oozing:  did drain at one point Pus: no Fevers: no Nausea/vomiting: no Status: fluctuating Treatments attempted:none  Tetanus: not up to date -- refuses  Relevant past medical, surgical, family and social history reviewed and updated as indicated. Interim medical history since our last  visit reviewed. Allergies and medications reviewed and updated.  Review of Systems  Constitutional:  Negative for activity change, diaphoresis, fatigue and fever.  Respiratory:  Negative for cough, chest tightness, shortness of breath and wheezing.   Cardiovascular:  Negative for chest pain, palpitations and leg swelling.  Gastrointestinal: Negative.   Skin:  Positive for wound.  Neurological: Negative.   Psychiatric/Behavioral: Negative.      Per HPI unless specifically indicated above     Objective:    BP 132/80 (BP Location: Left Arm, Patient Position: Sitting, Cuff Size: Normal)   Pulse 90   Temp 97.8 F (36.6 C) (Oral)   Ht 5' 9.02" (1.753 m)   Wt 212 lb 11.2 oz (96.5 kg)   SpO2 97%   BMI 31.40 kg/m   Wt Readings from Last 3 Encounters:  01/31/23 212 lb 11.2 oz (96.5 kg)  12/29/22 213 lb 8 oz (96.8 kg)  12/22/22 212 lb 8 oz (96.4 kg)    Physical Exam Vitals and nursing note reviewed.  Constitutional:      General: He is awake. He is not in acute distress.    Appearance: He is well-developed and well-groomed. He is obese. He is not ill-appearing or toxic-appearing.  HENT:     Head: Normocephalic and atraumatic.     Right Ear: Hearing normal. No drainage.     Left Ear: Hearing normal. No drainage.  Eyes:  General: Lids are normal.        Right eye: No discharge.        Left eye: No discharge.     Conjunctiva/sclera: Conjunctivae normal.     Pupils: Pupils are equal, round, and reactive to light.  Neck:     Thyroid: No thyromegaly.     Vascular: No carotid bruit.  Cardiovascular:     Rate and Rhythm: Normal rate and regular rhythm.     Heart sounds: Normal heart sounds, S1 normal and S2 normal. No murmur heard.    No gallop.  Pulmonary:     Effort: Pulmonary effort is normal. No accessory muscle usage or respiratory distress.     Breath sounds: Normal breath sounds.  Abdominal:     General: Bowel sounds are normal.     Palpations: Abdomen is soft. There  is no hepatomegaly or splenomegaly.  Musculoskeletal:        General: Normal range of motion.     Cervical back: Normal range of motion and neck supple.     Right lower leg: No edema.     Left lower leg: No edema.  Skin:    General: Skin is warm and dry.     Capillary Refill: Capillary refill takes less than 2 seconds.     Findings: Abscess present. No rash.       Neurological:     Mental Status: He is alert and oriented to person, place, and time.     Deep Tendon Reflexes: Reflexes are normal and symmetric.     Reflex Scores:      Brachioradialis reflexes are 2+ on the right side and 2+ on the left side.      Patellar reflexes are 2+ on the right side and 2+ on the left side. Psychiatric:        Attention and Perception: Attention normal.        Mood and Affect: Mood normal.        Speech: Speech normal.        Behavior: Behavior normal. Behavior is cooperative.        Thought Content: Thought content normal.     Results for orders placed or performed in visit on 12/29/22  CBC with Differential/Platelet  Result Value Ref Range   WBC 9.1 3.4 - 10.8 x10E3/uL   RBC 4.85 4.14 - 5.80 x10E6/uL   Hemoglobin 15.6 13.0 - 17.7 g/dL   Hematocrit 44.4 37.5 - 51.0 %   MCV 92 79 - 97 fL   MCH 32.2 26.6 - 33.0 pg   MCHC 35.1 31.5 - 35.7 g/dL   RDW 12.7 11.6 - 15.4 %   Platelets 265 150 - 450 x10E3/uL   Neutrophils 54 Not Estab. %   Lymphs 37 Not Estab. %   Monocytes 6 Not Estab. %   Eos 2 Not Estab. %   Basos 1 Not Estab. %   Neutrophils Absolute 4.9 1.4 - 7.0 x10E3/uL   Lymphocytes Absolute 3.4 (H) 0.7 - 3.1 x10E3/uL   Monocytes Absolute 0.5 0.1 - 0.9 x10E3/uL   EOS (ABSOLUTE) 0.2 0.0 - 0.4 x10E3/uL   Basophils Absolute 0.1 0.0 - 0.2 x10E3/uL   Immature Granulocytes 0 Not Estab. %   Immature Grans (Abs) 0.0 0.0 - 0.1 x10E3/uL  Comprehensive metabolic panel  Result Value Ref Range   Glucose 86 70 - 99 mg/dL   BUN 13 6 - 24 mg/dL   Creatinine, Ser 0.94 0.76 - 1.27 mg/dL  eGFR 95 >59 mL/min/1.73   BUN/Creatinine Ratio 14 9 - 20   Sodium 136 134 - 144 mmol/L   Potassium 4.0 3.5 - 5.2 mmol/L   Chloride 97 96 - 106 mmol/L   CO2 21 20 - 29 mmol/L   Calcium 9.5 8.7 - 10.2 mg/dL   Total Protein 6.8 6.0 - 8.5 g/dL   Albumin 4.8 3.8 - 4.9 g/dL   Globulin, Total 2.0 1.5 - 4.5 g/dL   Albumin/Globulin Ratio 2.4 (H) 1.2 - 2.2   Bilirubin Total 0.6 0.0 - 1.2 mg/dL   Alkaline Phosphatase 74 44 - 121 IU/L   AST 22 0 - 40 IU/L   ALT 36 0 - 44 IU/L  Lipid Panel w/o Chol/HDL Ratio  Result Value Ref Range   Cholesterol, Total 170 100 - 199 mg/dL   Triglycerides 272 (H) 0 - 149 mg/dL   HDL 37 (L) >39 mg/dL   VLDL Cholesterol Cal 45 (H) 5 - 40 mg/dL   LDL Chol Calc (NIH) 88 0 - 99 mg/dL  TSH  Result Value Ref Range   TSH 2.700 0.450 - 4.500 uIU/mL  PSA  Result Value Ref Range   Prostate Specific Ag, Serum 0.6 0.0 - 4.0 ng/mL  Amylase  Result Value Ref Range   Amylase 36 31 - 110 U/L  Lipase  Result Value Ref Range   Lipase 41 13 - 78 U/L  Urine Microalbumin w/creat. ratio  Result Value Ref Range   Creatinine, Urine 44.6 Not Estab. mg/dL   Microalbumin, Urine <3.0 Not Estab. ug/mL   Microalb/Creat Ratio <7 0 - 29 mg/g creat  Hemoglobin A1c  Result Value Ref Range   Hgb A1c MFr Bld 5.6 4.8 - 5.6 %   Est. average glucose Bld gHb Est-mCnc 114 mg/dL      Assessment & Plan:   Problem List Items Addressed This Visit       Cardiovascular and Mediastinum   Benign essential hypertension - Primary    Chronic, improving.  BP trending down towards goal.  Continue to recommend reduction of alcohol use as suspect this drives BP up.  Recommend he monitor BP at least a few mornings a week at home and document.  DASH diet at home.  Continue current medication regimen and adjust as needed.  Labs: BMP.  Recommend complete cessation of alcohol use and smoking.         Relevant Orders   Basic metabolic panel     Other   Cellulitis of right middle finger    Acute  after self draining of wound, suspect introduced bacteria with self care.  At this time start Bactrim BID for 7 days and Mupirocin.  Recommend he apply warm compresses 4 times a day for 15 minutes to area.  Do not drain area, allow to self heal.  Return in 2 weeks.        Follow up plan: Return in about 2 weeks (around 02/14/2023) for CELLULITIS.

## 2023-02-01 LAB — BASIC METABOLIC PANEL
BUN/Creatinine Ratio: 15 (ref 9–20)
BUN: 15 mg/dL (ref 6–24)
CO2: 24 mmol/L (ref 20–29)
Calcium: 9.5 mg/dL (ref 8.7–10.2)
Chloride: 100 mmol/L (ref 96–106)
Creatinine, Ser: 1.03 mg/dL (ref 0.76–1.27)
Glucose: 100 mg/dL — ABNORMAL HIGH (ref 70–99)
Potassium: 3.9 mmol/L (ref 3.5–5.2)
Sodium: 138 mmol/L (ref 134–144)
eGFR: 85 mL/min/{1.73_m2} (ref >59)

## 2023-02-01 NOTE — Progress Notes (Signed)
Contacted via MyChart   Good morning Norton, your labs have returned and kidney function remains stable.  Great news!!  Continue medications as ordered. Keep being amazing!!  Thank you for allowing me to participate in your care.  I appreciate you. Kindest regards, Katheleen Stella

## 2023-02-19 NOTE — Patient Instructions (Signed)
Placed new referral to endocrinology, we need him seen more frequently and you all need assist with pump placement.  Cellulitis, Adult  Cellulitis is a skin infection. The infected area is often warm, red, swollen, and sore. It occurs most often on the legs, feet, and toes, but can happen on any part of the body. This condition can be life-threatening without treatment. It is very important to get treated right away. What are the causes? This condition is caused by bacteria. The bacteria enter through a break in the skin, such as: A cut. A burn. A bug bite. An animal bite. An open sore. A crack. What increases the risk? Having a weak body's defense system (immune system). Being older than 57 years old. Having a blood sugar problem (diabetes). Having a long-term liver disease (cirrhosis) or kidney disease. Being very overweight (obese). Having a skin problem, such as: An itchy rash. A rash caused by a fungus. A rash with blisters. Slow movement of blood in the veins (venous stasis). Fluid buildup below the skin (edema). This condition is more likely to occur in people who: Have open cuts, burns, bites, or scrapes on the skin. Have been treated with high-energy rays (radiation). Use IV drugs. What are the signs or symptoms? Skin that: Looks red or purple, or slightly darker than your usual skin color. Has streaks. Has spots. Is swollen. Is sore or painful when you touch it. Is warm. A fever. Chills. Blisters. Tiredness (fatigue). How is this treated? Medicines to treat infections or allergies. Rest. Placing cold or warm cloths on the skin. Staying in the hospital, if the condition is very bad. You may need medicines through an IV. Follow these instructions at home: Medicines Take over-the-counter and prescription medicines only as told by your doctor. If you were prescribed antibiotics, take them as told by your doctor. Do not stop using them even if you start to feel  better. General instructions Drink enough fluid to keep your pee (urine) pale yellow. Do not touch or rub the infected area. Raise (elevate) the infected area above the level of your heart while you are sitting or lying down. Return to your normal activities when your doctor says that it is safe. Place cold or warm cloths on the area as told by your doctor. Keep all follow-up visits. Your doctor will need to make sure that a more serious infection is not developing. Contact a doctor if: You have a fever. You do not start to get better after 1-2 days of treatment. Your bone or joint under the infected area starts to hurt after the skin has healed. Your infection comes back in the same area or another area. Signs of this may include: You have a swollen bump in the area. Your red area gets larger, turns dark in color, or hurts more. You have more fluid coming from the wound. Pus or a bad smell develops in your infected area. You have more pain. You feel sick and have muscle aches and weakness. You develop vomiting or watery poop that will not go away. Get help right away if: You see red streaks coming from the area. You notice the skin turns purple or black and falls off. These symptoms may be an emergency. Get help right away. Call 911. Do not wait to see if the symptoms will go away. Do not drive yourself to the hospital. This information is not intended to replace advice given to you by your health care provider. Make sure you   discuss any questions you have with your health care provider. Document Revised: 06/22/2022 Document Reviewed: 06/22/2022 Elsevier Patient Education  2023 [employer] Inc.  

## 2023-02-21 ENCOUNTER — Ambulatory Visit (INDEPENDENT_AMBULATORY_CARE_PROVIDER_SITE_OTHER): Payer: BC Managed Care – PPO | Admitting: Nurse Practitioner

## 2023-02-21 ENCOUNTER — Encounter: Payer: Self-pay | Admitting: Nurse Practitioner

## 2023-02-21 VITALS — BP 132/80 | HR 97 | Temp 97.9°F | Ht 69.02 in | Wt 210.0 lb

## 2023-02-21 DIAGNOSIS — L03011 Cellulitis of right finger: Secondary | ICD-10-CM

## 2023-02-21 DIAGNOSIS — Z8619 Personal history of other infectious and parasitic diseases: Secondary | ICD-10-CM | POA: Insufficient documentation

## 2023-02-21 NOTE — Progress Notes (Signed)
BP 132/80 (BP Location: Left Arm, Patient Position: Sitting, Cuff Size: Normal)   Pulse 97   Temp 97.9 F (36.6 C) (Oral)   Ht 5' 9.02" (1.753 m)   Wt 210 lb (95.3 kg)   SpO2 96%   BMI 31.00 kg/m    Subjective:    Patient ID: Kevin Buckley, male    DOB: [birth year], 57 y.o.   MRN: [id number]  HPI: Kevin Buckley is a 57 y.o. male  Chief Complaint  Patient presents with   Cellulitis    Here for follow up, patient states it is much better   SKIN INFECTION Follow-up today for skin infection treated on 01/31/23 with Bactrim -- infection to right middle finger. He reports it drained on own, which helped it feel better. Duration: weeks Location: right middle finger History of trauma in area: no Redness: no Swelling: no Oozing: no Pus: no Fevers: no Nausea/vomiting: no Status: better Treatments attempted:antibiotics and warm compresses  Tetanus: UTD   Relevant past medical, surgical, family and social history reviewed and updated as indicated. Interim medical history since our last visit reviewed. Allergies and medications reviewed and updated.  Review of Systems  Constitutional:  Negative for activity change, diaphoresis, fatigue and fever.  Respiratory:  Negative for cough, chest tightness, shortness of breath and wheezing.   Cardiovascular:  Negative for chest pain, palpitations and leg swelling.  Gastrointestinal: Negative.   Skin:  Positive for wound.  Neurological: Negative.   Psychiatric/Behavioral: Negative.      Per HPI unless specifically indicated above     Objective:    BP 132/80 (BP Location: Left Arm, Patient Position: Sitting, Cuff Size: Normal)   Pulse 97   Temp 97.9 F (36.6 C) (Oral)   Ht 5' 9.02" (1.753 m)   Wt 210 lb (95.3 kg)   SpO2 96%   BMI 31.00 kg/m   Wt Readings from Last 3 Encounters:  02/21/23 210 lb (95.3 kg)  01/31/23 212 lb 11.2 oz (96.5 kg)  12/29/22 213 lb 8 oz (96.8 kg)    Physical Exam Vitals and nursing note reviewed.   Constitutional:      General: He is awake. He is not in acute distress.    Appearance: He is well-developed and well-groomed. He is obese. He is not ill-appearing or toxic-appearing.  HENT:     Head: Normocephalic and atraumatic.     Right Ear: Hearing normal. No drainage.     Left Ear: Hearing normal. No drainage.  Eyes:     General: Lids are normal.        Right eye: No discharge.        Left eye: No discharge.     Conjunctiva/sclera: Conjunctivae normal.     Pupils: Pupils are equal, round, and reactive to light.  Neck:     Thyroid: No thyromegaly.     Vascular: No carotid bruit.  Cardiovascular:     Rate and Rhythm: Normal rate and regular rhythm.     Heart sounds: Normal heart sounds, S1 normal and S2 normal. No murmur heard.    No gallop.  Pulmonary:     Effort: Pulmonary effort is normal. No accessory muscle usage or respiratory distress.     Breath sounds: Normal breath sounds.  Abdominal:     General: Bowel sounds are normal.     Palpations: Abdomen is soft. There is no hepatomegaly or splenomegaly.  Musculoskeletal:        General: Normal range of motion.  Cervical back: Normal range of motion and neck supple.     Right lower leg: No edema.     Left lower leg: No edema.  Skin:    General: Skin is warm and dry.     Capillary Refill: Capillary refill takes less than 2 seconds.     Findings: No abscess or rash.       Neurological:     Mental Status: He is alert and oriented to person, place, and time.     Deep Tendon Reflexes: Reflexes are normal and symmetric.     Reflex Scores:      Brachioradialis reflexes are 2+ on the right side and 2+ on the left side.      Patellar reflexes are 2+ on the right side and 2+ on the left side. Psychiatric:        Attention and Perception: Attention normal.        Mood and Affect: Mood normal.        Speech: Speech normal.        Behavior: Behavior normal. Behavior is cooperative.        Thought Content: Thought content  normal.     Results for orders placed or performed in visit on 01/31/23  Basic metabolic panel  Result Value Ref Range   Glucose 100 (H) 70 - 99 mg/dL   BUN 15 6 - 24 mg/dL   Creatinine, Ser 8.58 0.76 - 1.27 mg/dL   eGFR 85 >85 OY/DXA/1.28   BUN/Creatinine Ratio 15 9 - 20   Sodium 138 134 - 144 mmol/L   Potassium 3.9 3.5 - 5.2 mmol/L   Chloride 100 96 - 106 mmol/L   CO2 24 20 - 29 mmol/L   Calcium 9.5 8.7 - 10.2 mg/dL      Assessment & Plan:   Problem List Items Addressed This Visit       Other   Cellulitis of right middle finger - Primary    Acute and improving.  At this time continue Mupirocin.  Do not drain area, allow to self heal.  If any return of symptoms then recommend he return to office for assessment.        Follow up plan: Return in about 4 months (around 06/29/2023) for HTN/HLD.

## 2023-02-21 NOTE — Assessment & Plan Note (Signed)
Acute and improving.  At this time continue Mupirocin.  Do not drain area, allow to self heal.  If any return of symptoms then recommend he return to office for assessment.

## 2023-03-03 ENCOUNTER — Other Ambulatory Visit: Payer: Self-pay | Admitting: Nurse Practitioner

## 2023-03-03 NOTE — Telephone Encounter (Signed)
Unable to refill per protocol, Rx expired. Lipitor was discontinued 12/30/22.  Requested Prescriptions  Pending Prescriptions Disp Refills   atorvastatin (LIPITOR) 40 MG tablet [Pharmacy Med Name: ATORVASTATIN 40 MG TABLET] 90 tablet 4    Sig: TAKE 1 TABLET BY MOUTH EVERY DAY     Cardiovascular:  Antilipid - Statins Failed - 03/03/2023 11:56 AM      Failed - Lipid Panel in normal range within the last 12 months    Cholesterol, Total  Date Value Ref Range Status  12/29/2022 170 100 - 199 mg/dL Final   LDL Chol Calc (NIH)  Date Value Ref Range Status  12/29/2022 88 0 - 99 mg/dL Final   HDL  Date Value Ref Range Status  12/29/2022 37 (L) >39 mg/dL Final   Triglycerides  Date Value Ref Range Status  12/29/2022 272 (H) 0 - 149 mg/dL Final         Passed - Patient is not pregnant      Passed - Valid encounter within last 12 months    Recent Outpatient Visits           1 week ago Cellulitis of right middle finger   [place] [facility] [place], [place]ister T, NP   1 month ago Benign essential hypertension   [place] [facility] [place], [place] Point T, NP   2 months ago Benign essential hypertension   [place] [facility] [place], [place]yll[place] T, NP   2 months ago Herpes zoster without complication   [place] [facility] [place], Quinby T, NP   7 months ago Benign essential hypertension   [place] [facility] [place], Dorie Rank, NP       Future Appointments             In 3 months Cannady, Dorie Rank, NP [place] [facility]             mupirocin ointment (BACTROBAN) 2 % [Pharmacy Med Name: MUPIROCIN 2% OINTMENT] 22 g 0    Sig: APPLY TO AFFECTED AREA TWICE A DAY     Off-Protocol Failed - 03/03/2023 11:56 AM      Failed - Medication not assigned to a protocol, review manually.      Passed - Valid encounter within last 12 months    Recent Outpatient Visits           1 week ago  Cellulitis of right middle finger   [place] [facility] [place], [place] T, NP   1 month ago Benign essential hypertension   [place] [facility] [place], Scotia T, NP   2 months ago Benign essential hypertension   [place] [facility] [place], [place] T, NP   2 months ago Herpes zoster without complication   [place] [facility] [place], Corrie Dandy T, NP   7 months ago Benign essential hypertension   [place] [facility] [place], Dorie Rank, NP       Future Appointments             In 3 months Cannady, Dorie Rank, NP [place] [facility], [facility]

## 2023-03-03 NOTE — Telephone Encounter (Signed)
Requested medication (s) are due for refill today: yes  Requested medication (s) are on the active medication list: yes  Last refill:  01/31/23  Future visit scheduled: yes  Notes to clinic: Medication not assigned to a protocol, review manually.       Requested Prescriptions  Pending Prescriptions Disp Refills   mupirocin ointment (BACTROBAN) 2 % [Pharmacy Med Name: MUPIROCIN 2% OINTMENT] 22 g 0    Sig: APPLY TO AFFECTED AREA TWICE A DAY     Off-Protocol Failed - 03/03/2023 11:56 AM      Failed - Medication not assigned to a protocol, review manually.      Passed - Valid encounter within last 12 months    Recent Outpatient Visits           1 week ago Cellulitis of right middle finger   [place] [facility] [place], [place]con[place] T, NP   1 month ago Benign essential hypertension   [place] [facility] [place], Muse T, NP   2 months ago Benign essential hypertension   [place] [facility] [place], [place] T, NP   2 months ago Herpes zoster without complication   [place] [facility] [place], Corrie Dandy T, NP   7 months ago Benign essential hypertension   [place] [facility] [place], [place] T, NP       Future Appointments             In 3 months Cannady, [place] T, NP [place] [facility], [facility]            Refused Prescriptions Disp Refills   atorvastatin (LIPITOR) 40 MG tablet [Pharmacy Med Name: ATORVASTATIN 40 MG TABLET] 90 tablet 4    Sig: TAKE 1 TABLET BY MOUTH EVERY DAY     Cardiovascular:  Antilipid - Statins Failed - 03/03/2023 11:56 AM      Failed - Lipid Panel in normal range within the last 12 months    Cholesterol, Total  Date Value Ref Range Status  12/29/2022 170 100 - 199 mg/dL Final   LDL Chol Calc (NIH)  Date Value Ref Range Status  12/29/2022 88 0 - 99 mg/dL Final   HDL  Date Value Ref Range Status  12/29/2022 37 (L) >39 mg/dL Final   Triglycerides  Date  Value Ref Range Status  12/29/2022 272 (H) 0 - 149 mg/dL Final         Passed - Patient is not pregnant      Passed - Valid encounter within last 12 months    Recent Outpatient Visits           1 week ago Cellulitis of right middle finger   [place] [facility] [place], [place] T, NP   1 month ago Benign essential hypertension   [place] [facility] [place], Mesa T, NP   2 months ago Benign essential hypertension   [place] [facility] [place], Paradise T, NP   2 months ago Herpes zoster without complication   [place] [facility] [place], [place] T, NP   7 months ago Benign essential hypertension   [place] [facility] [place], Dorie Rank, NP       Future Appointments             In 3 months Cannady, Dorie Rank, NP [place] [facility], [facility]

## 2023-03-16 ENCOUNTER — Other Ambulatory Visit: Payer: Self-pay | Admitting: Nurse Practitioner

## 2023-03-16 NOTE — Telephone Encounter (Signed)
Requested medication (s) are due for refill today: for review  Requested medication (s) are on the active medication list: yes    Last refill: 03/03/23   22g    0 refills  Future visit scheduled yes 06/27/23  Notes to clinic:Off protocol, please review. Thank you.  Requested Prescriptions  Pending Prescriptions Disp Refills   mupirocin ointment (BACTROBAN) 2 % [Pharmacy Med Name: MUPIROCIN 2% OINTMENT] 22 g 0    Sig: APPLY TO AFFECTED AREA TWICE A DAY     Off-Protocol Failed - 03/16/2023 12:25 PM      Failed - Medication not assigned to a protocol, review manually.      Passed - Valid encounter within last 12 months    Recent Outpatient Visits           3 weeks ago Cellulitis of right middle finger   [place] [facility] [place], Healy T, NP   1 month ago Benign essential hypertension   [place] [facility] [place], [place] T, NP   2 months ago Benign essential hypertension   [place] [facility] [place], Weed T, NP   2 months ago Herpes zoster without complication   [place] [facility] [place], Corrie Dandy T, NP   7 months ago Benign essential hypertension   [place] [facility] [place], [place] T, NP       Future Appointments             In 3 months Cannady, [place] T, NP [place] [facility]            Refused Prescriptions Disp Refills   atorvastatin (LIPITOR) 40 MG tablet [Pharmacy Med Name: ATORVASTATIN 40 MG TABLET] 90 tablet 4    Sig: TAKE 1 TABLET BY MOUTH EVERY DAY     Cardiovascular:  Antilipid - Statins Failed - 03/16/2023 12:25 PM      Failed - Lipid Panel in normal range within the last 12 months    Cholesterol, Total  Date Value Ref Range Status  12/29/2022 170 100 - 199 mg/dL Final   LDL Chol Calc (NIH)  Date Value Ref Range Status  12/29/2022 88 0 - 99 mg/dL Final   HDL  Date Value Ref Range Status  12/29/2022 37 (L) >39 mg/dL Final    Triglycerides  Date Value Ref Range Status  12/29/2022 272 (H) 0 - 149 mg/dL Final         Passed - Patient is not pregnant      Passed - Valid encounter within last 12 months    Recent Outpatient Visits           3 weeks ago Cellulitis of right middle finger   [place] [facility] [place], Buchanan T, NP   1 month ago Benign essential hypertension   [place] [facility] [place], Media T, NP   2 months ago Benign essential hypertension   [place] [facility] [place], Denton T, NP   2 months ago Herpes zoster without complication   [place] [facility] [place], [place] T, NP   7 months ago Benign essential hypertension   [place] [facility] [place], Dorie Rank, NP       Future Appointments             In 3 months Cannady, Dorie Rank, NP  [facility], [facility]

## 2023-03-16 NOTE — Telephone Encounter (Signed)
Requested Prescriptions  Pending Prescriptions Disp Refills   mupirocin ointment (BACTROBAN) 2 % [Pharmacy Med Name: MUPIROCIN 2% OINTMENT] 22 g 0    Sig: APPLY TO AFFECTED AREA TWICE A DAY     Off-Protocol Failed - 03/16/2023 12:25 PM      Failed - Medication not assigned to a protocol, review manually.      Passed - Valid encounter within last 12 months    Recent Outpatient Visits           3 weeks ago Cellulitis of right middle finger   [place] [facility] [place], [place] T, NP   1 month ago Benign essential hypertension   [place] [facility] [place], Halliday T, NP   2 months ago Benign essential hypertension   [place] [facility] [place], [place] T, NP   2 months ago Herpes zoster without complication   [place] [facility] [place], Corrie Dandy T, NP   7 months ago Benign essential hypertension   [place] [place], [place] T, NP       Future Appointments             In 3 months Cannady, [place] T, NP [place] [facility]             atorvastatin (LIPITOR) 40 MG tablet [Pharmacy Med Name: ATORVASTATIN 40 MG TABLET] 90 tablet 4    Sig: TAKE 1 TABLET BY MOUTH EVERY DAY     Cardiovascular:  Antilipid - Statins Failed - 03/16/2023 12:25 PM      Failed - Lipid Panel in normal range within the last 12 months    Cholesterol, Total  Date Value Ref Range Status  12/29/2022 170 100 - 199 mg/dL Final   LDL Chol Calc (NIH)  Date Value Ref Range Status  12/29/2022 88 0 - 99 mg/dL Final   HDL  Date Value Ref Range Status  12/29/2022 37 (L) >39 mg/dL Final   Triglycerides  Date Value Ref Range Status  12/29/2022 272 (H) 0 - 149 mg/dL Final         Passed - Patient is not pregnant      Passed - Valid encounter within last 12 months    Recent Outpatient Visits           3 weeks ago Cellulitis of right middle finger   [place] [facility] [place],  [place] T, NP   1 month ago Benign essential hypertension   [place] [facility] [place], [place]er [place] T, NP   2 months ago Benign essential hypertension   [place] [facility] [place], [place] T, NP   2 months ago Herpes zoster without complication   [place] [facility] [place], [place] T, NP   7 months ago Benign essential hypertension   [place] [facility] [place], Dorie Rank, NP       Future Appointments             In 3 months Cannady, Dorie Rank, NP  [facility], [facility]

## 2023-06-25 NOTE — Patient Instructions (Signed)
Be Involved in Caring For Your Health:  Taking Medications When medications are taken as directed, they can greatly improve your health. But if they are not taken as prescribed, they may not work. In some cases, not taking them correctly can be harmful. To help ensure your treatment remains effective and safe, understand your medications and how to take them. Bring your medications to each visit for review by your provider.  Your lab results, notes, and after visit summary will be available on My Chart. We strongly encourage you to use this feature. If lab results are abnormal the clinic will contact you with the appropriate steps. If the clinic does not contact you assume the results are satisfactory. You can always view your results on My Chart. If you have questions regarding your health or results, please contact the clinic during office hours. You can also ask questions on My Chart.  We at [facility] are grateful that you chose us to provide your care. We strive to provide evidence-based and compassionate care and are always looking for feedback. If you get a survey from the clinic please complete this so we can hear your opinions.  DASH Eating Plan DASH stands for Dietary Approaches to Stop Hypertension. The DASH eating plan is a healthy eating plan that has been shown to: Lower high blood pressure (hypertension). Reduce your risk for type 2 diabetes, heart disease, and stroke. Help with weight loss. What are tips for following this plan? Reading food labels Check food labels for the amount of salt (sodium) per serving. Choose foods with less than 5 percent of the Daily Value (DV) of sodium. In general, foods with less than 300 milligrams (mg) of sodium per serving fit into this eating plan. To find whole grains, look for the word "whole" as the first word in the ingredient list. Shopping Buy products labeled as "low-sodium" or "no salt added." Buy fresh foods. Avoid canned  foods and pre-made or frozen meals. Cooking Try not to add salt when you cook. Use salt-free seasonings or herbs instead of table salt or sea salt. Check with your health care provider or pharmacist before using salt substitutes. Do not fry foods. Cook foods in healthy ways, such as baking, boiling, grilling, roasting, or broiling. Cook using oils that are good for your heart. These include olive, canola, avocado, soybean, and sunflower oil. Meal planning  Eat a balanced diet. This should include: 4 or more servings of fruits and 4 or more servings of vegetables each day. Try to fill half of your plate with fruits and vegetables. 6-8 servings of whole grains each day. 6 or less servings of lean meat, poultry, or fish each day. 1 oz is 1 serving. A 3 oz (85 g) serving of meat is about the same size as the palm of your hand. One egg is 1 oz (28 g). 2-3 servings of low-fat dairy each day. One serving is 1 cup (237 mL). 1 serving of nuts, seeds, or beans 5 times each week. 2-3 servings of heart-healthy fats. Healthy fats called omega-3 fatty acids are found in foods such as walnuts, flaxseeds, fortified milks, and eggs. These fats are also found in cold-water fish, such as sardines, salmon, and mackerel. Limit how much you eat of: Canned or prepackaged foods. Food that is high in trans fat, such as fried foods. Food that is high in saturated fat, such as fatty meat. Desserts and other sweets, sugary drinks, and other foods with added sugar. Full-fat   dairy products. Do not salt foods before eating. Do not eat more than 4 egg yolks a week. Try to eat at least 2 vegetarian meals a week. Eat more home-cooked food and less restaurant, buffet, and fast food. Lifestyle When eating at a restaurant, ask if your food can be made with less salt or no salt. If you drink alcohol: Limit how much you have to: 0-1 drink a day if you are male. 0-2 drinks a day if you are male. Know how much alcohol is in  your drink. In the [place] one drink is one 12 oz bottle of beer (355 mL), one 5 oz glass of wine (148 mL), or one 1 oz glass of hard liquor (44 mL). General information Avoid eating more than 2,300 mg of salt a day. If you have hypertension, you may need to reduce your sodium intake to 1,500 mg a day. Work with your provider to stay at a healthy body weight or lose weight. Ask what the best weight range is for you. On most days of the week, get at least 30 minutes of exercise that causes your heart to beat faster. This may include walking, swimming, or biking. Work with your provider or dietitian to adjust your eating plan to meet your specific calorie needs. What foods should I eat? Fruits All fresh, dried, or frozen fruit. Canned fruits that are in their natural juice and do not have sugar added to them. Vegetables Fresh or frozen vegetables that are raw, steamed, roasted, or grilled. Low-sodium or reduced-sodium tomato and vegetable juice. Low-sodium or reduced-sodium tomato sauce and tomato paste. Low-sodium or reduced-sodium canned vegetables. Grains Whole-grain or whole-wheat bread. Whole-grain or whole-wheat pasta. Brown rice. Oatmeal. Quinoa. Bulgur. Whole-grain and low-sodium cereals. Pita bread. Low-fat, low-sodium crackers. Whole-wheat flour tortillas. Meats and other proteins Skinless chicken or turkey. Ground chicken or turkey. Pork with fat trimmed off. Fish and seafood. Egg whites. Dried beans, peas, or lentils. Unsalted nuts, nut butters, and seeds. Unsalted canned beans. Lean cuts of beef with fat trimmed off. Low-sodium, lean precooked or cured meat, such as sausages or meat loaves. Dairy Low-fat (1%) or fat-free (skim) milk. Reduced-fat, low-fat, or fat-free cheeses. Nonfat, low-sodium ricotta or cottage cheese. Low-fat or nonfat yogurt. Low-fat, low-sodium cheese. Fats and oils Soft margarine without trans fats. Vegetable oil. Reduced-fat, low-fat, or light mayonnaise and salad  dressings (reduced-sodium). Canola, safflower, olive, avocado, soybean, and sunflower oils. Avocado. Seasonings and condiments Herbs. Spices. Seasoning mixes without salt. Other foods Unsalted popcorn and pretzels. Fat-free sweets. The items listed above may not be all the foods and drinks you can have. Talk to a dietitian to learn more. What foods should I avoid? Fruits Canned fruit in a light or heavy syrup. Fried fruit. Fruit in cream or butter sauce. Vegetables Creamed or fried vegetables. Vegetables in a cheese sauce. Regular canned vegetables that are not marked as low-sodium or reduced-sodium. Regular canned tomato sauce and paste that are not marked as low-sodium or reduced-sodium. Regular tomato and vegetable juices that are not marked as low-sodium or reduced-sodium. Pickles. Olives. Grains Baked goods made with fat, such as croissants, muffins, or some breads. Dry pasta or rice meal packs. Meats and other proteins Fatty cuts of meat. Ribs. Fried meat. Bacon. Bologna, salami, and other precooked or cured meats, such as sausages or meat loaves, that are not lean and low in sodium. Fat from the back of a pig (fatback). Bratwurst. Salted nuts and seeds. Canned beans with added salt. Canned   or smoked fish. Whole eggs or egg yolks. Chicken or turkey with skin. Dairy Whole or 2% milk, cream, and half-and-half. Whole or full-fat cream cheese. Whole-fat or sweetened yogurt. Full-fat cheese. Nondairy creamers. Whipped toppings. Processed cheese and cheese spreads. Fats and oils Butter. Stick margarine. Lard. Shortening. Ghee. Bacon fat. Tropical oils, such as coconut, palm kernel, or palm oil. Seasonings and condiments Onion salt, garlic salt, seasoned salt, table salt, and sea salt. Worcestershire sauce. Tartar sauce. Barbecue sauce. Teriyaki sauce. Soy sauce, including reduced-sodium soy sauce. Steak sauce. Canned and packaged gravies. Fish sauce. Oyster sauce. Cocktail sauce. Store-bought  horseradish. Ketchup. Mustard. Meat flavorings and tenderizers. Bouillon cubes. Hot sauces. Pre-made or packaged marinades. Pre-made or packaged taco seasonings. Relishes. Regular salad dressings. Other foods Salted popcorn and pretzels. The items listed above may not be all the foods and drinks you should avoid. Talk to a dietitian to learn more. Where to find more information National Heart, Lung, and Blood Institute (NHLBI): nhlbi.nih.gov American Heart Association (AHA): heart.org Academy of Nutrition and Dietetics: eatright.org National Kidney Foundation (NKF): kidney.org This information is not intended to replace advice given to you by your health care provider. Make sure you discuss any questions you have with your health care provider. Document Revised: 11/11/2022 Document Reviewed: 11/11/2022 Elsevier Patient Education  2024 Elsevier Inc.  

## 2023-06-27 ENCOUNTER — Ambulatory Visit (INDEPENDENT_AMBULATORY_CARE_PROVIDER_SITE_OTHER): Payer: BC Managed Care – PPO | Admitting: Nurse Practitioner

## 2023-06-27 ENCOUNTER — Encounter: Payer: Self-pay | Admitting: Nurse Practitioner

## 2023-06-27 VITALS — BP 136/82 | HR 90 | Temp 97.5°F | Ht 69.0 in | Wt 210.2 lb

## 2023-06-27 DIAGNOSIS — F17210 Nicotine dependence, cigarettes, uncomplicated: Secondary | ICD-10-CM

## 2023-06-27 DIAGNOSIS — Z6831 Body mass index (BMI) 31.0-31.9, adult: Secondary | ICD-10-CM

## 2023-06-27 DIAGNOSIS — Z789 Other specified health status: Secondary | ICD-10-CM

## 2023-06-27 DIAGNOSIS — I10 Essential (primary) hypertension: Secondary | ICD-10-CM

## 2023-06-27 DIAGNOSIS — E6609 Other obesity due to excess calories: Secondary | ICD-10-CM

## 2023-06-27 DIAGNOSIS — E7800 Pure hypercholesterolemia, unspecified: Secondary | ICD-10-CM | POA: Diagnosis not present

## 2023-06-27 NOTE — Assessment & Plan Note (Signed)
BMI 31.04.  Recommended eating smaller high protein, low fat meals more frequently and exercising 30 mins a day 5 times a week with a goal of 10-15lb weight loss in the next 3 months. Patient voiced their understanding and motivation to adhere to these recommendations.

## 2023-06-27 NOTE — Progress Notes (Signed)
BP 136/82 (BP Location: Left Arm, Patient Position: Sitting, Cuff Size: Normal)   Pulse 90   Temp (!) 97.5 F (36.4 C) (Oral)   Ht 5\' 9"  (1.753 m)   Wt 210 lb 3.2 oz (95.3 kg)   SpO2 96%   BMI 31.04 kg/m    Subjective:    Patient ID: Kevin Buckley, male    DOB: Nov 28, 1965, 58 y.o.   MRN: [id number]  HPI: Kevin Buckley is a 57 y.o. male  Chief Complaint  Patient presents with   Hyperlipidemia   Hypertension   HYPERTENSION / HYPERLIPIDEMIA Continues on Amlodipine, Losartan-hydrochlorothiazide, and Atorvastatin.  Does drink alcohol, continues 4 beers a night -- is trying to reduce. Is a smoker -- smokes 1 PPD, has been smoking since mid-20's on and off.  Does not wish to obtain lung cancer screening at this time.  He is going to stop smoking at beginning of year. Satisfied with current treatment? yes Duration of hypertension: chronic BP monitoring frequency: not checking BP range:  BP medication side effects: no Duration of hyperlipidemia: chronic Cholesterol medication side effects: no Cholesterol supplements: none Medication compliance: good compliance Aspirin: no Recent stressors: no Recurrent headaches: no Visual changes: no Palpitations: no Dyspnea: no Chest pain: no Lower extremity edema: no Dizzy/lightheaded: no   Relevant past medical, surgical, family and social history reviewed and updated as indicated. Interim medical history since our last visit reviewed. Allergies and medications reviewed and updated.  Review of Systems  Constitutional:  Negative for activity change, diaphoresis, fatigue and fever.  Respiratory:  Negative for cough, chest tightness, shortness of breath and wheezing.   Cardiovascular:  Negative for chest pain, palpitations and leg swelling.  Gastrointestinal: Negative.   Neurological: Negative.   Psychiatric/Behavioral: Negative.      Per HPI unless specifically indicated above     Objective:    BP 136/82 (BP Location: Left Arm,  Patient Position: Sitting, Cuff Size: Normal)   Pulse 90   Temp (!) 97.5 F (36.4 C) (Oral)   Ht 5\' 9"  (1.753 m)   Wt 210 lb 3.2 oz (95.3 kg)   SpO2 96%   BMI 31.04 kg/m   Wt Readings from Last 3 Encounters:  06/27/23 210 lb 3.2 oz (95.3 kg)  02/21/23 210 lb (95.3 kg)  01/31/23 212 lb 11.2 oz (96.5 kg)    Physical Exam Vitals and nursing note reviewed.  Constitutional:      General: He is awake. He is not in acute distress.    Appearance: He is well-developed and well-groomed. He is obese. He is not ill-appearing or toxic-appearing.  HENT:     Head: Normocephalic.     Right Ear: Hearing and external ear normal.     Left Ear: Hearing and external ear normal.  Eyes:     General: Lids are normal.     Extraocular Movements: Extraocular movements intact.     Conjunctiva/sclera: Conjunctivae normal.  Neck:     Thyroid: No thyromegaly.     Vascular: No carotid bruit.  Cardiovascular:     Rate and Rhythm: Normal rate and regular rhythm.     Heart sounds: Normal heart sounds. No murmur heard.    No gallop.  Pulmonary:     Effort: Pulmonary effort is normal. No accessory muscle usage or respiratory distress.     Breath sounds: Normal breath sounds.  Abdominal:     General: Bowel sounds are normal. There is no distension.     Palpations: Abdomen  is soft.     Tenderness: There is no abdominal tenderness.  Musculoskeletal:     Cervical back: Full passive range of motion without pain.     Right lower leg: No edema.     Left lower leg: No edema.  Lymphadenopathy:     Cervical: No cervical adenopathy.  Skin:    General: Skin is warm.     Capillary Refill: Capillary refill takes less than 2 seconds.  Neurological:     Mental Status: He is alert and oriented to person, place, and time.     Deep Tendon Reflexes: Reflexes are normal and symmetric.     Reflex Scores:      Brachioradialis reflexes are 2+ on the right side and 2+ on the left side.      Patellar reflexes are 2+ on the  right side and 2+ on the left side. Psychiatric:        Attention and Perception: Attention normal.        Mood and Affect: Mood normal.        Speech: Speech normal.        Behavior: Behavior normal. Behavior is cooperative.        Thought Content: Thought content normal.    Results for orders placed or performed in visit on 01/31/23  Basic metabolic panel  Result Value Ref Range   Glucose 100 (H) 70 - 99 mg/dL   BUN 15 6 - 24 mg/dL   Creatinine, Ser 1.61 0.76 - 1.27 mg/dL   eGFR 85 >09 UE/AVW/0.98   BUN/Creatinine Ratio 15 9 - 20   Sodium 138 134 - 144 mmol/L   Potassium 3.9 3.5 - 5.2 mmol/L   Chloride 100 96 - 106 mmol/L   CO2 24 20 - 29 mmol/L   Calcium 9.5 8.7 - 10.2 mg/dL      Assessment & Plan:   Problem List Items Addressed This Visit       Cardiovascular and Mediastinum   Benign essential hypertension - Primary    Chronic, improving.  BP trending down on recheck.  Continue to recommend reduction of alcohol use as suspect this drives BP up.  Recommend he monitor BP at least a few mornings a week at home and document.  DASH diet at home.  Continue current medication regimen and adjust as needed, may need to increase Hydrochlorothiazide next visit to 25 MG.  Labs: BMP.  Recommend complete cessation of alcohol use and smoking.         Relevant Orders   Basic metabolic panel     Other   Alcohol use    A few beers a night with AUDIT 11 last check -- recommend he cut back on alcohol use which would benefit blood pressure and overall health.        Nicotine dependence, cigarettes, uncomplicated    I have recommended complete cessation of tobacco use. I have discussed various options available for assistance with tobacco cessation including over the counter methods (Nicotine gum, patch and lozenges). We also discussed prescription options (Chantix, Nicotine Inhaler / Nasal Spray). The patient is not interested in pursuing any prescription tobacco cessation options at this  time.  Refuses lung cancer screening, highly recommended he obtain.        Obesity    BMI 31.04.  Recommended eating smaller high protein, low fat meals more frequently and exercising 30 mins a day 5 times a week with a goal of 10-15lb weight loss in the next 3  months. Patient voiced their understanding and motivation to adhere to these recommendations.       Pure hypercholesterolemia    Chronic, ongoing.  Continue current medication regimen and adjust as needed.  Lipid panel today.         Relevant Orders   Lipid Panel w/o Chol/HDL Ratio     Follow up plan: Return in about 6 months (around 12/28/2023) for Annual physical after 12/30/23.

## 2023-06-27 NOTE — Assessment & Plan Note (Signed)
I have recommended complete cessation of tobacco use. I have discussed various options available for assistance with tobacco cessation including over the counter methods (Nicotine gum, patch and lozenges). We also discussed prescription options (Chantix, Nicotine Inhaler / Nasal Spray). The patient is not interested in pursuing any prescription tobacco cessation options at this time.  Refuses lung cancer screening, highly recommended he obtain.   

## 2023-06-27 NOTE — Assessment & Plan Note (Signed)
Chronic, ongoing.  Continue current medication regimen and adjust as needed. Lipid panel today. 

## 2023-06-27 NOTE — Assessment & Plan Note (Signed)
A few beers a night with AUDIT 11 last check -- recommend he cut back on alcohol use which would benefit blood pressure and overall health.

## 2023-06-27 NOTE — Assessment & Plan Note (Signed)
Chronic, improving.  BP trending down on recheck.  Continue to recommend reduction of alcohol use as suspect this drives BP up.  Recommend he monitor BP at least a few mornings a week at home and document.  DASH diet at home.  Continue current medication regimen and adjust as needed, may need to increase Hydrochlorothiazide next visit to 25 MG.  Labs: BMP.  Recommend complete cessation of alcohol use and smoking.

## 2023-06-28 LAB — LIPID PANEL W/O CHOL/HDL RATIO
Cholesterol, Total: 244 mg/dL — ABNORMAL HIGH (ref 100–199)
HDL: 48 mg/dL (ref >39)
LDL Chol Calc (NIH): 163 mg/dL — ABNORMAL HIGH (ref 0–99)
Triglycerides: 182 mg/dL — ABNORMAL HIGH (ref 0–149)
VLDL Cholesterol Cal: 33 mg/dL (ref 5–40)

## 2023-06-28 LAB — BASIC METABOLIC PANEL
BUN/Creatinine Ratio: 11 (ref 9–20)
BUN: 12 mg/dL (ref 6–24)
CO2: 24 mmol/L (ref 20–29)
Calcium: 9.1 mg/dL (ref 8.7–10.2)
Chloride: 102 mmol/L (ref 96–106)
Creatinine, Ser: 1.06 mg/dL (ref 0.76–1.27)
Glucose: 100 mg/dL — ABNORMAL HIGH (ref 70–99)
Potassium: 3.9 mmol/L (ref 3.5–5.2)
Sodium: 140 mmol/L (ref 134–144)
eGFR: 82 mL/min/{1.73_m2} (ref >59)

## 2023-06-28 MED ORDER — ATORVASTATIN CALCIUM 80 MG PO TABS: 80.0000 mg | ORAL_TABLET | Freq: Every day | ORAL | 3 refills | Status: DC

## 2023-06-28 NOTE — Progress Notes (Signed)
Contacted via MyChart - although please ensure he has received and call if needed as need to know if he is taking statin  Good morning Kevin Buckley, your labs have returned: - Kidney function, creatinine and eGFR, remains normal. - Cholesterol levels have trended way up, are you taking your Atorvastatin daily?  Please let me know as you should be taking every day.  Your past levels were better.  Any questions? Keep being amazing!!  Thank you for allowing me to participate in your care.  I appreciate you. Kindest regards, Drisana Schweickert

## 2023-06-28 NOTE — Addendum Note (Signed)
Addended by: Aura Dials T on: 06/28/2023 04:04 PM   Modules accepted: Orders

## 2023-12-31 NOTE — Patient Instructions (Signed)

## 2024-01-02 ENCOUNTER — Encounter: Payer: Self-pay | Admitting: Nurse Practitioner

## 2024-01-02 ENCOUNTER — Ambulatory Visit (INDEPENDENT_AMBULATORY_CARE_PROVIDER_SITE_OTHER): Payer: BC Managed Care – PPO | Admitting: Nurse Practitioner

## 2024-01-02 VITALS — BP 134/82 | HR 89 | Temp 98.0°F | Ht 70.4 in | Wt 196.0 lb

## 2024-01-02 DIAGNOSIS — F419 Anxiety disorder, unspecified: Secondary | ICD-10-CM | POA: Diagnosis not present

## 2024-01-02 DIAGNOSIS — I10 Essential (primary) hypertension: Secondary | ICD-10-CM | POA: Diagnosis not present

## 2024-01-02 DIAGNOSIS — R7301 Impaired fasting glucose: Secondary | ICD-10-CM

## 2024-01-02 DIAGNOSIS — Z0000 Encounter for general adult medical examination without abnormal findings: Secondary | ICD-10-CM

## 2024-01-02 DIAGNOSIS — Z800 Family history of malignant neoplasm of digestive organs: Secondary | ICD-10-CM

## 2024-01-02 DIAGNOSIS — N400 Enlarged prostate without lower urinary tract symptoms: Secondary | ICD-10-CM | POA: Diagnosis not present

## 2024-01-02 DIAGNOSIS — E6609 Other obesity due to excess calories: Secondary | ICD-10-CM

## 2024-01-02 DIAGNOSIS — Z789 Other specified health status: Secondary | ICD-10-CM

## 2024-01-02 DIAGNOSIS — F1090 Alcohol use, unspecified, uncomplicated: Secondary | ICD-10-CM

## 2024-01-02 DIAGNOSIS — E7800 Pure hypercholesterolemia, unspecified: Secondary | ICD-10-CM | POA: Diagnosis not present

## 2024-01-02 DIAGNOSIS — E66811 Obesity, class 1: Secondary | ICD-10-CM

## 2024-01-02 DIAGNOSIS — F17210 Nicotine dependence, cigarettes, uncomplicated: Secondary | ICD-10-CM

## 2024-01-02 DIAGNOSIS — Z6831 Body mass index (BMI) 31.0-31.9, adult: Secondary | ICD-10-CM

## 2024-01-02 LAB — MICROALBUMIN, URINE WAIVED
Creatinine, Urine Waived: 100 mg/dL (ref 10–300)
Microalb, Ur Waived: 10 mg/L (ref 0–19)
Microalb/Creat Ratio: <30 mg/g (ref <30)

## 2024-01-02 MED ORDER — LOSARTAN POTASSIUM-HCTZ 100-12.5 MG PO TABS: 1.0000 | ORAL_TABLET | Freq: Every day | ORAL | 4 refills | Status: AC

## 2024-01-02 MED ORDER — ATORVASTATIN CALCIUM 80 MG PO TABS: 80.0000 mg | ORAL_TABLET | Freq: Every day | ORAL | 3 refills | Status: AC

## 2024-01-02 MED ORDER — SERTRALINE HCL 100 MG PO TABS: ORAL_TABLET | ORAL | 4 refills | Status: AC

## 2024-01-02 MED ORDER — AMLODIPINE BESYLATE 10 MG PO TABS: 10.0000 mg | ORAL_TABLET | Freq: Every day | ORAL | 4 refills | Status: AC

## 2024-01-02 MED ORDER — FENOFIBRATE MICRONIZED 134 MG PO CAPS: 134.0000 mg | ORAL_CAPSULE | Freq: Every day | ORAL | 4 refills | Status: AC

## 2024-01-02 NOTE — Assessment & Plan Note (Signed)
 In father, patient is due for colonoscopy (X5MW) and discussed with him -- he plans to obtain in upcoming months.  Lipase and Amylase check today.

## 2024-01-02 NOTE — Assessment & Plan Note (Signed)
 Chronic, stable with medication, although is exacerbated by divorce.  Denies SI/HI.  At this time continue current medication regimen and adjust as needed.  Refills sent.  Severe anxiety in practice setting.

## 2024-01-02 NOTE — Assessment & Plan Note (Signed)
 A1c 5.6% last check, discussed with patient.  Stable.  Recheck annually.

## 2024-01-02 NOTE — Assessment & Plan Note (Signed)
I have recommended complete cessation of tobacco use. I have discussed various options available for assistance with tobacco cessation including over the counter methods (Nicotine gum, patch and lozenges). We also discussed prescription options (Chantix, Nicotine Inhaler / Nasal Spray). The patient is not interested in pursuing any prescription tobacco cessation options at this time.  Refuses lung cancer screening, highly recommended he obtain.   

## 2024-01-02 NOTE — Assessment & Plan Note (Signed)
 Chronic, improving.  BP trending down on recheck, has some Pais coat syndrome.  Continue to recommend reduction of alcohol use as suspect this drives BP up, he has been reducing use.  Recommend he monitor BP at least a few mornings a week at home and document.  DASH diet at home.  Continue current medication regimen and adjust as needed, may need to increase Hydrochlorothiazide next visit to 25 MG.  Labs: CBC, CMP, TSH.  Recommend complete cessation of smoking.

## 2024-01-02 NOTE — Progress Notes (Signed)
 BP 134/82 (BP Location: Left Arm, Patient Position: Sitting, Cuff Size: Normal)   Pulse 89   Temp 98 F (36.7 C) (Oral)   Ht 5' 10.4" (1.788 m)   Wt 196 lb (88.9 kg)   SpO2 98%   BMI 27.80 kg/m    Subjective:    Patient ID: Kevin Buckley, male    DOB: 1966-03-07, 59 y.o.   MRN: [id number]  HPI: Kevin Buckley is a 58 y.o. male presenting on 01/02/2024 for comprehensive medical examination. Current medical complaints include:none  He currently lives with: wife Interim Problems from his last visit: no  HYPERTENSION  Taking Losartan-HCTZ 100-12.5 MG daily and Amlodipine 5 MG daily.  Atorvastatin and Lofibra for HLD.    Only on the weekends drinks beer, 5-6 beers.  Smoker, smokes 1 1/2 PPD, has been smoking since mid-20's on and off.  Does not wish to obtain lung cancer screening at this time. Family history of pancreatic cancer.  He has lost 14 pounds with cutting back on alcohol and stress. Hypertension status: stable  Satisfied with current treatment? yes Duration of hypertension: chronic BP monitoring frequency:   occasionally BP range: 130-135/90 BP medication side effects:  no Medication compliance: good compliance Aspirin: no Recurrent headaches: no Visual changes: no Palpitations: no Dyspnea: no Chest pain: no Lower extremity edema: no Dizzy/lightheaded: no  The 10-year ASCVD risk score (Arnett DK, et al., 2019) is: 18.3%   Values used to calculate the score:     Age: 31 years     Sex: Male     Is Non-Hispanic African American: No     Diabetic: No     Tobacco smoker: Yes     Systolic Blood Pressure: 134 mmHg     Is BP treated: Yes     HDL Cholesterol: 48 mg/dL     Total Cholesterol: 244 mg/dL   Impaired Fasting Glucose Family history of pancreatic cancer in father and paternal aunt. HbA1C:  Lab Results  Component Value Date   HGBA1C 5.6 12/29/2022  Duration of elevated blood sugar: years Polydipsia: no Polyuria: no Weight change: no Visual disturbance:  no Glucose Monitoring: no    Accucheck frequency: Not Checking    Fasting glucose:     Post prandial:  Diabetic Education: Not Completed Family history of diabetes: yes   DEPRESSION Taking Sertraline and Buspar. Currently going through divorce which is a stressor for him -- 25 years of marriage. Mood status: stable Satisfied with current treatment?: yes Symptom severity: moderate  Duration of current treatment : chronic Side effects: no Medication compliance: good compliance Psychotherapy/counseling: no Depressed mood: yes Anxious mood: yes Anhedonia: no Significant weight loss or gain: no Insomnia: no Fatigue: no Feelings of worthlessness or guilt: yes Impaired concentration/indecisiveness: no Suicidal ideations: no Hopelessness: no Crying spells: yes    01/02/2024    8:05 AM 06/27/2023   10:56 AM 01/31/2023    2:28 PM 01/31/2023    2:17 PM 12/29/2022    1:43 PM  Depression screen PHQ 2/9  Decreased Interest 0 0 0 0 0  Down, Depressed, Hopeless 1 0 0 0 0  PHQ - 2 Score 1 0 0 0 0  Altered sleeping 0 0 0 0 0  Tired, decreased energy 0 0 0 0 0  Change in appetite 1 0 0 0 0  Feeling bad or failure about yourself  1 0 0 0 0  Trouble concentrating 0 0 0 0 0  Moving slowly or fidgety/restless 0  0 0 0 0  Suicidal thoughts 0 0 0 0 0  PHQ-9 Score 3 0 0 0 0  Difficult doing work/chores Somewhat difficult Not difficult at all Not difficult at all Not difficult at all Not difficult at all       01/02/2024    8:05 AM 06/27/2023   10:56 AM 01/31/2023    2:28 PM 01/31/2023    2:17 PM  GAD 7 : Generalized Anxiety Score  Nervous, Anxious, on Edge 0 1 0 0  Control/stop worrying 1 0 0 0  Worry too much - different things 1 0 0 0  Trouble relaxing 0 0 0 0  Restless 0 0 0 0  Easily annoyed or irritable 1 0 0 0  Afraid - awful might happen 0 0 0 0  Total GAD 7 Score 3 1 0 0  Anxiety Difficulty Somewhat difficult Not difficult at all Not difficult at all Not difficult at all    Functional Status Survey: Is the patient deaf or have difficulty hearing?: No Does the patient have difficulty seeing, even when wearing glasses/contacts?: No Does the patient have difficulty concentrating, remembering, or making decisions?: No Does the patient have difficulty walking or climbing stairs?: No Does the patient have difficulty dressing or bathing?: No Does the patient have difficulty doing errands alone such as visiting a doctor's office or shopping?: No     12/29/2022    1:43 PM 01/31/2023    2:17 PM 01/31/2023    2:28 PM 06/27/2023   10:55 AM 01/02/2024    8:05 AM  Fall Risk  Falls in the past year? 0 0 0 0 0  Was there an injury with Fall? 0 0 0 0 0  Fall Risk Category Calculator 0 0 0 0 0  Patient at Risk for Falls Due to No Fall Risks No Fall Risks No Fall Risks No Fall Risks No Fall Risks  Fall risk Follow up Falls evaluation completed Falls evaluation completed Falls evaluation completed Falls evaluation completed Falls evaluation completed    Advanced Directives Does patient have a HCPOA?    no If yes, name and contact information:  Does patient have a living will or MOST form?  no  Past Medical History:  Past Medical History:  Diagnosis Date   Anxiety    Elevated cholesterol    Hypertension     Surgical History:  History reviewed. No pertinent surgical history.  Medications:  No current outpatient medications on file prior to visit.   No current facility-administered medications on file prior to visit.    Allergies:  No Known Allergies  Social History:  Social History   Socioeconomic History   Marital status: Married    Spouse name: Not on file   Number of children: 1   Years of education: Not on file   Highest education level: Bachelor's degree (e.g., BA, AB, BS)  Occupational History   Not on file  Tobacco Use   Smoking status: Every Day    Current packs/day: 1.00    Average packs/day: 1 pack/day for 5.0 years (5.0 ttl pk-yrs)     Types: Cigarettes   Smokeless tobacco: Never  Vaping Use   Vaping status: Never Used  Substance and Sexual Activity   Alcohol use: Yes    Alcohol/week: 12.0 standard drinks of alcohol    Types: 12 Cans of beer per week   Drug use: No   Sexual activity: Yes    Birth control/protection: None  Other Topics Concern  Not on file  Social History Narrative   Not on file   Social Drivers of Health   Financial Resource Strain: Low Risk  (01/01/2024)   Overall Financial Resource Strain (CARDIA)    Difficulty of Paying Living Expenses: Not very hard  Food Insecurity: No Food Insecurity (01/01/2024)   Hunger Vital Sign    Worried About Running Out of Food in the Last Year: Never true    Ran Out of Food in the Last Year: Never true  Transportation Needs: No Transportation Needs (01/01/2024)   PRAPARE - Administrator, Civil Service (Medical): No    Lack of Transportation (Non-Medical): No  Physical Activity: Sufficiently Active (01/01/2024)   Exercise Vital Sign    Days of Exercise per Week: 5 days    Minutes of Exercise per Session: 30 min  Stress: No Stress Concern Present (01/01/2024)   [employer] of Occupational Health - Occupational Stress Questionnaire    Feeling of Stress : Only a little  Social Connections: Moderately Isolated (01/01/2024)   Social Connection and Isolation Panel [NHANES]    Frequency of Communication with Friends and Family: More than three times a week    Frequency of Social Gatherings with Friends and Family: Once a week    Attends Religious Services: 1 to 4 times per year    Active Member of [employer] or Organizations: No    Attends Banker Meetings: Not on file    Marital Status: Separated  Intimate Partner Violence: Not At Risk (01/02/2024)   Humiliation, Afraid, Rape, and Kick questionnaire    Fear of Current or Ex-Partner: No    Emotionally Abused: No    Physically Abused: No    Sexually Abused: No   Social History   Tobacco  Use  Smoking Status Every Day   Current packs/day: 1.00   Average packs/day: 1 pack/day for 5.0 years (5.0 ttl pk-yrs)   Types: Cigarettes  Smokeless Tobacco Never   Social History   Substance and Sexual Activity  Alcohol Use Yes   Alcohol/week: 12.0 standard drinks of alcohol   Types: 12 Cans of beer per week    Family History:  Family History  Problem Relation Age of Onset   Multiple sclerosis Mother    Pancreatic cancer Father    Anxiety disorder Maternal Grandmother    Hypertension Paternal Grandmother     Past medical history, surgical history, medications, allergies, family history and social history reviewed with patient today and changes made to appropriate areas of the chart.   ROS All other ROS negative except what is listed above and in the HPI.      Objective:    BP 134/82 (BP Location: Left Arm, Patient Position: Sitting, Cuff Size: Normal)   Pulse 89   Temp 98 F (36.7 C) (Oral)   Ht 5' 10.4" (1.788 m)   Wt 196 lb (88.9 kg)   SpO2 98%   BMI 27.80 kg/m   Wt Readings from Last 3 Encounters:  01/02/24 196 lb (88.9 kg)  06/27/23 210 lb 3.2 oz (95.3 kg)  02/21/23 210 lb (95.3 kg)    Physical Exam Vitals and nursing note reviewed.  Constitutional:      General: He is awake. He is not in acute distress.    Appearance: He is well-developed and well-groomed. He is not ill-appearing or toxic-appearing.  HENT:     Head: Normocephalic and atraumatic.     Right Ear: Hearing, tympanic membrane, ear canal and external  ear normal. No drainage.     Left Ear: Hearing, tympanic membrane, ear canal and external ear normal. No drainage.     Nose: Nose normal.     Mouth/Throat:     Pharynx: Uvula midline.  Eyes:     General: Lids are normal.        Right eye: No discharge.        Left eye: No discharge.     Extraocular Movements: Extraocular movements intact.     Conjunctiva/sclera: Conjunctivae normal.     Pupils: Pupils are equal, round, and reactive to  light.     Visual Fields: Right eye visual fields normal and left eye visual fields normal.  Neck:     Thyroid: No thyromegaly.     Vascular: No carotid bruit or JVD.     Trachea: Trachea normal.  Cardiovascular:     Rate and Rhythm: Normal rate and regular rhythm.     Heart sounds: Normal heart sounds, S1 normal and S2 normal. No murmur heard.    No gallop.  Pulmonary:     Effort: Pulmonary effort is normal. No accessory muscle usage or respiratory distress.     Breath sounds: Normal breath sounds.  Abdominal:     General: Bowel sounds are normal.     Palpations: Abdomen is soft. There is no hepatomegaly or splenomegaly.     Tenderness: There is no abdominal tenderness.  Musculoskeletal:        General: Normal range of motion.     Cervical back: Normal range of motion and neck supple.     Right lower leg: No edema.     Left lower leg: No edema.  Lymphadenopathy:     Head:     Right side of head: No submental, submandibular, tonsillar, preauricular or posterior auricular adenopathy.     Left side of head: No submental, submandibular, tonsillar, preauricular or posterior auricular adenopathy.     Cervical: No cervical adenopathy.  Skin:    General: Skin is warm and dry.     Capillary Refill: Capillary refill takes less than 2 seconds.     Findings: No rash.  Neurological:     Mental Status: He is alert and oriented to person, place, and time.     Gait: Gait is intact.     Deep Tendon Reflexes: Reflexes are normal and symmetric.     Reflex Scores:      Brachioradialis reflexes are 2+ on the right side and 2+ on the left side.      Patellar reflexes are 2+ on the right side and 2+ on the left side. Psychiatric:        Attention and Perception: Attention normal.        Mood and Affect: Mood normal.        Speech: Speech normal.        Behavior: Behavior normal. Behavior is cooperative.        Thought Content: Thought content normal.        Cognition and Memory: Cognition  normal.     Results for orders placed or performed in visit on 06/27/23  Basic metabolic panel   Collection Time: 06/27/23 11:26 AM  Result Value Ref Range   Glucose 100 (H) 70 - 99 mg/dL   BUN 12 6 - 24 mg/dL   Creatinine, Ser 7.82 0.76 - 1.27 mg/dL   eGFR 82 >95 AO/ZHY/8.65   BUN/Creatinine Ratio 11 9 - 20   Sodium 140 134 - 144  mmol/L   Potassium 3.9 3.5 - 5.2 mmol/L   Chloride 102 96 - 106 mmol/L   CO2 24 20 - 29 mmol/L   Calcium 9.1 8.7 - 10.2 mg/dL  Lipid Panel w/o Chol/HDL Ratio   Collection Time: 06/27/23 11:26 AM  Result Value Ref Range   Cholesterol, Total 244 (H) 100 - 199 mg/dL   Triglycerides 130 (H) 0 - 149 mg/dL   HDL 48 >86 mg/dL   VLDL Cholesterol Cal 33 5 - 40 mg/dL   LDL Chol Calc (NIH) 578 (H) 0 - 99 mg/dL      Assessment & Plan:   Problem List Items Addressed This Visit       Cardiovascular and Mediastinum   Benign essential hypertension - Primary   Chronic, improving.  BP trending down on recheck, has some Blackburn coat syndrome.  Continue to recommend reduction of alcohol use as suspect this drives BP up, he has been reducing use.  Recommend he monitor BP at least a few mornings a week at home and document.  DASH diet at home.  Continue current medication regimen and adjust as needed, may need to increase Hydrochlorothiazide next visit to 25 MG.  Labs: CBC, CMP, TSH.  Recommend complete cessation of smoking.         Relevant Medications   amLODipine (NORVASC) 10 MG tablet   atorvastatin (LIPITOR) 80 MG tablet   fenofibrate micronized (LOFIBRA) 134 MG capsule   losartan-hydrochlorothiazide (HYZAAR) 100-12.5 MG tablet   Other Relevant Orders   Comprehensive metabolic panel   CBC with Differential/Platelet   TSH   Microalbumin, Urine Waived     Endocrine   IFG (impaired fasting glucose)   A1c 5.6% last check, discussed with patient.  Stable.  Recheck annually.      Relevant Orders   HgB A1c     Other   Alcohol use   Has cut back to only  weekends at this time.  Drinks 5-6 beers on the weekend.  Has had weight loss with this and with current increased stressors.  Recommend continue to work towards cessation of alcohol use or reduced use as currently doing.  He is aware of effect on blood pressure and overall health.      Anxiety   Chronic, stable with medication, although is exacerbated by divorce.  Denies SI/HI.  At this time continue current medication regimen and adjust as needed.  Refills sent.  Severe anxiety in practice setting.      Relevant Medications   sertraline (ZOLOFT) 100 MG tablet   Family history of pancreatic cancer   In father, patient is due for colonoscopy (I6NG) and discussed with him -- he plans to obtain in upcoming months.  Lipase and Amylase check today.      Relevant Orders   Amylase   Lipase   Nicotine dependence, cigarettes, uncomplicated   I have recommended complete cessation of tobacco use. I have discussed various options available for assistance with tobacco cessation including over the counter methods (Nicotine gum, patch and lozenges). We also discussed prescription options (Chantix, Nicotine Inhaler / Nasal Spray). The patient is not interested in pursuing any prescription tobacco cessation options at this time.  Refuses lung cancer screening, highly recommended he obtain.        Obesity   BMI 27.80, has lost 14 pounds total at this time.  Recommended eating smaller high protein, low fat meals more frequently and exercising 30 mins a day 5 times a week with a goal  of 10-15lb weight loss in the next 3 months. Patient voiced their understanding and motivation to adhere to these recommendations.       Pure hypercholesterolemia   Chronic, ongoing.  Continue current medication regimen and adjust as needed.  Lipid panel today.         Relevant Medications   amLODipine (NORVASC) 10 MG tablet   atorvastatin (LIPITOR) 80 MG tablet   fenofibrate micronized (LOFIBRA) 134 MG capsule    losartan-hydrochlorothiazide (HYZAAR) 100-12.5 MG tablet   Other Relevant Orders   CBC with Differential/Platelet   Lipid Panel w/o Chol/HDL Ratio   Other Visit Diagnoses       Benign prostatic hyperplasia without lower urinary tract symptoms       PSA on labs today.   Relevant Orders   PSA     Encounter for annual physical exam       Annual physical today with labs and health maintenance reviewed, discussed with patient.       Discussed aspirin prophylaxis for myocardial infarction prevention and decision was it was not indicated  LABORATORY TESTING:  Health maintenance labs ordered today as discussed above.   The natural history of prostate cancer and ongoing controversy regarding screening and potential treatment outcomes of prostate cancer has been discussed with the patient. The meaning of a false positive PSA and a false negative PSA has been discussed. He indicates understanding of the limitations of this screening test and wishes to proceed with screening PSA testing.  IMMUNIZATIONS:   - Tetanus vaccination status reviewed: Refused. - Influenza: Refused - Pneumovax: Refused - Prevnar: Not applicable - Zostavax vaccine: Refuses at this time  SCREENING: - Colonoscopy: Refused -- will get in future after stressors improved Discussed with patient purpose of the colonoscopy is to detect colon cancer at curable precancerous or early stages   - AAA Screening: Not applicable  -Hearing Test: Not applicable  -Spirometry: Not applicable   PATIENT COUNSELING:    Sexuality: Discussed sexually transmitted diseases, partner selection, use of condoms, avoidance of unintended pregnancy  and contraceptive alternatives.   Advised to avoid cigarette smoking.  I discussed with the patient that most people either abstain from alcohol or drink within safe limits (<=14/week and <=4 drinks/occasion for males, <=7/weeks and <= 3 drinks/occasion for females) and that the risk for alcohol  disorders and other health effects rises proportionally with the number of drinks per week and how often a drinker exceeds daily limits.  Discussed cessation/primary prevention of drug use and availability of treatment for abuse.   Diet: Encouraged to adjust caloric intake to maintain  or achieve ideal body weight, to reduce intake of dietary saturated fat and total fat, to limit sodium intake by avoiding high sodium foods and not adding table salt, and to maintain adequate dietary potassium and calcium preferably from fresh fruits, vegetables, and low-fat dairy products.    Stressed the importance of regular exercise  Injury prevention: Discussed safety belts, safety helmets, smoke detector, smoking near bedding or upholstery.   Dental health: Discussed importance of regular tooth brushing, flossing, and dental visits.   Follow up plan: NEXT PREVENTATIVE PHYSICAL DUE IN 1 YEAR. Return in about 6 months (around 07/01/2024) for HTN/HLD, Depression.

## 2024-01-02 NOTE — Assessment & Plan Note (Signed)
 Has cut back to only weekends at this time.  Drinks 5-6 beers on the weekend.  Has had weight loss with this and with current increased stressors.  Recommend continue to work towards cessation of alcohol use or reduced use as currently doing.  He is aware of effect on blood pressure and overall health.

## 2024-01-02 NOTE — Assessment & Plan Note (Signed)
 Chronic, ongoing.  Continue current medication regimen and adjust as needed. Lipid panel today.

## 2024-01-02 NOTE — Assessment & Plan Note (Signed)
 BMI 27.80, has lost 14 pounds total at this time.  Recommended eating smaller high protein, low fat meals more frequently and exercising 30 mins a day 5 times a week with a goal of 10-15lb weight loss in the next 3 months. Patient voiced their understanding and motivation to adhere to these recommendations.

## 2024-01-03 ENCOUNTER — Encounter: Payer: Self-pay | Admitting: Nurse Practitioner

## 2024-01-03 LAB — CBC WITH DIFFERENTIAL/PLATELET
Basophils Absolute: 0.1 10*3/uL (ref 0.0–0.2)
Basos: 1 %
EOS (ABSOLUTE): 0.3 10*3/uL (ref 0.0–0.4)
Eos: 4 %
Hematocrit: 42.6 % (ref 37.5–51.0)
Hemoglobin: 14.5 g/dL (ref 13.0–17.7)
Immature Grans (Abs): 0 10*3/uL (ref 0.0–0.1)
Immature Granulocytes: 0 %
Lymphocytes Absolute: 2.2 10*3/uL (ref 0.7–3.1)
Lymphs: 30 %
MCH: 31.9 pg (ref 26.6–33.0)
MCHC: 34 g/dL (ref 31.5–35.7)
MCV: 94 fL (ref 79–97)
Monocytes Absolute: 0.5 10*3/uL (ref 0.1–0.9)
Monocytes: 7 %
Neutrophils Absolute: 4.3 10*3/uL (ref 1.4–7.0)
Neutrophils: 58 %
Platelets: 302 10*3/uL (ref 150–450)
RBC: 4.55 x10E6/uL (ref 4.14–5.80)
RDW: 12.2 % (ref 11.6–15.4)
WBC: 7.2 10*3/uL (ref 3.4–10.8)

## 2024-01-03 LAB — TSH: TSH: 2.63 u[IU]/mL (ref 0.450–4.500)

## 2024-01-03 LAB — AMYLASE: Amylase: 35 U/L (ref 31–110)

## 2024-01-03 LAB — COMPREHENSIVE METABOLIC PANEL
ALT: 14 IU/L (ref 0–44)
AST: 13 IU/L (ref 0–40)
Albumin: 4.2 g/dL (ref 3.8–4.9)
Alkaline Phosphatase: 69 IU/L (ref 44–121)
BUN/Creatinine Ratio: 14 (ref 9–20)
BUN: 14 mg/dL (ref 6–24)
Bilirubin Total: 0.4 mg/dL (ref 0.0–1.2)
CO2: 24 mmol/L (ref 20–29)
Calcium: 9.6 mg/dL (ref 8.7–10.2)
Chloride: 103 mmol/L (ref 96–106)
Creatinine, Ser: 1.03 mg/dL (ref 0.76–1.27)
Globulin, Total: 2 g/dL (ref 1.5–4.5)
Glucose: 109 mg/dL — ABNORMAL HIGH (ref 70–99)
Potassium: 4.1 mmol/L (ref 3.5–5.2)
Sodium: 140 mmol/L (ref 134–144)
Total Protein: 6.2 g/dL (ref 6.0–8.5)
eGFR: 85 mL/min/{1.73_m2} (ref >59)

## 2024-01-03 LAB — LIPID PANEL W/O CHOL/HDL RATIO
Cholesterol, Total: 125 mg/dL (ref 100–199)
HDL: 41 mg/dL (ref >39)
LDL Chol Calc (NIH): 69 mg/dL (ref 0–99)
Triglycerides: 76 mg/dL (ref 0–149)
VLDL Cholesterol Cal: 15 mg/dL (ref 5–40)

## 2024-01-03 LAB — HEMOGLOBIN A1C
Est. average glucose Bld gHb Est-mCnc: 120 mg/dL
Hgb A1c MFr Bld: 5.8 % — ABNORMAL HIGH (ref 4.8–5.6)

## 2024-01-03 LAB — LIPASE: Lipase: 51 U/L (ref 13–78)

## 2024-01-03 LAB — PSA: Prostate Specific Ag, Serum: 1.1 ng/mL (ref 0.0–4.0)

## 2024-01-03 NOTE — Progress Notes (Signed)
 Contacted via MyChart   Good evening Kevin Buckley, your labs have returned: - Kidney function, creatinine and eGFR, remains normal, as is liver function, AST and ALT.  - A1c is 5.8%, in prediabetes range, focus heavily on healthy diet and regular exercise.  We will continue to monitor. - CBC shows no anemia or infection. - Remainder of labs are stable.  No changes needed at this time. Any questions? Keep being stellar!!  Thank you for allowing me to participate in your care.  I appreciate you. Kindest regards, Jakeim Sedore

## 2024-01-16 ENCOUNTER — Other Ambulatory Visit: Payer: Self-pay | Admitting: Nurse Practitioner

## 2024-01-17 NOTE — Telephone Encounter (Signed)
 Amlodipine was sent 01/02/24 #90/4 RF  The original prescription was discontinued on 01/02/2024 by Pablo Ledger, CMA for the following reason: Patient Preference   Requested Prescriptions  Pending Prescriptions Disp Refills   amLODipine (NORVASC) 10 MG tablet [Pharmacy Med Name: AMLODIPINE BESYLATE 10 MG TAB] 90 tablet 4    Sig: TAKE 1 TABLET BY MOUTH EVERY DAY     Cardiovascular: Calcium Channel Blockers 2 Failed - 01/17/2024 10:40 AM      Failed - Valid encounter within last 6 months    Recent Outpatient Visits           6 months ago Benign essential hypertension   [facility] [place], [place] T, NP   11 months ago Cellulitis of right middle finger   [place] [facility] [place], [place] T, NP   11 months ago Benign essential hypertension   [place]  [facility] [place], Corrie Dandy T, NP   1 year ago Benign essential hypertension   [place] [facility] [place], Corrie Dandy T, NP   1 year ago Herpes zoster without complication   [place] [facility] [place], Corrie Dandy T, NP       Future Appointments             In 5 months Cann[place], [place] T, NP [place] [facility]            Passed - Last BP in normal range    BP Readings from Last 1 Encounters:  01/02/24 134/82         Passed - Last Heart Rate in normal range    Pulse Readings from Last 1 Encounters:  01/02/24 89          busPIRone (BUSPAR) 10 MG tablet [Pharmacy Med Name: BUSPIRONE HCL 10 MG TABLET] 90 tablet 4    Sig: TAKE 1 TABLET BY MOUTH EVERY DAY     Psychiatry: Anxiolytics/Hypnotics - Non-controlled Passed - 01/17/2024 10:40 AM      Passed - Valid encounter within last 12 months    Recent Outpatient Visits           6 months ago Benign essential hypertension   [place] [facility] [place], [place] T, NP   11 months ago Cellulitis of right middle finger   [place] [facility] [place], [place] T, NP   11 months ago Benign essential hypertension   [place] [facility] [place], Corrie Dandy T, NP   1 year ago Benign essential hypertension   [place] [facility] [place], Corrie Dandy T, NP   1 year ago Herpes zoster without complication   [place] [facility] [place], Dorie Rank, NP       Future Appointments             In 5 months Cannady, Dorie Rank, NP [place] [facility]

## 2024-02-17 ENCOUNTER — Encounter: Payer: Self-pay | Admitting: Nurse Practitioner

## 2024-02-17 ENCOUNTER — Telehealth (INDEPENDENT_AMBULATORY_CARE_PROVIDER_SITE_OTHER): Admitting: Nurse Practitioner

## 2024-02-17 DIAGNOSIS — N529 Male erectile dysfunction, unspecified: Secondary | ICD-10-CM | POA: Insufficient documentation

## 2024-02-17 DIAGNOSIS — N522 Drug-induced erectile dysfunction: Secondary | ICD-10-CM

## 2024-02-17 MED ORDER — SILDENAFIL CITRATE 100 MG PO TABS: 50.0000 mg | ORAL_TABLET | Freq: Every day | ORAL | 4 refills | Status: AC | PRN

## 2024-02-17 NOTE — Progress Notes (Signed)
 There were no vitals taken for this visit.   Subjective:    Patient ID: Kevin Buckley, male    DOB: 01-05-1966, 58 y.o.   MRN: [id number]  HPI: Kevin Buckley is a 58 y.o. male  Chief Complaint  Patient presents with   Erectile Dysfunction   Virtual Visit via Video Note  I connected with Kevin Buckley on 02/17/24 at  2:20 PM EDT by a video enabled telemedicine application and verified that I am speaking with the correct person using two identifiers.  Location: Patient: home Provider: work   I discussed the limitations of evaluation and management by telemedicine and the availability of in person appointments. The patient expressed understanding and agreed to proceed.  I discussed the assessment and treatment plan with the patient. The patient was provided an opportunity to ask questions and all were answered. The patient agreed with the plan and demonstrated an understanding of the instructions.   The patient was advised to call back or seek an in-person evaluation if the symptoms worsen or if the condition fails to improve as anticipated.  I provided 25 minutes of non-face-to-face time during this encounter.   Marjie Skiff, NP   ERECTILE DYSFUNCTION Presents today to look into treatment for ED.  Has not taken medication before.  Has difficult time maintaining erection, not all the time.   Satisfied with current treatment?: yes Duration: on and off for one year Nocturia: no Urinary frequency:no Incomplete voiding: no Urgency: no Weak urinary stream: no Straining to start stream: no Dysuria: no Onset: gradual Severity: mild  Relevant past medical, surgical, family and social history reviewed and updated as indicated. Interim medical history since our last visit reviewed. Allergies and medications reviewed and updated.  Review of Systems  Constitutional:  Negative for activity change, diaphoresis, fatigue and fever.  Respiratory:  Negative for cough, chest  tightness, shortness of breath and wheezing.   Cardiovascular:  Negative for chest pain, palpitations and leg swelling.  Gastrointestinal: Negative.   Psychiatric/Behavioral: Negative.      Per HPI unless specifically indicated above     Objective:    There were no vitals taken for this visit.  Wt Readings from Last 3 Encounters:  01/02/24 196 lb (88.9 kg)  06/27/23 210 lb 3.2 oz (95.3 kg)  02/21/23 210 lb (95.3 kg)    Physical Exam Vitals and nursing note reviewed.  Constitutional:      General: He is awake. He is not in acute distress.    Appearance: He is well-developed. He is not ill-appearing.  HENT:     Head: Normocephalic.     Right Ear: Hearing normal. No drainage.     Left Ear: Hearing normal. No drainage.  Eyes:     General: Lids are normal.        Right eye: No discharge.        Left eye: No discharge.     Conjunctiva/sclera: Conjunctivae normal.  Pulmonary:     Effort: Pulmonary effort is normal. No accessory muscle usage or respiratory distress.  Musculoskeletal:     Cervical back: Normal range of motion.  Neurological:     Mental Status: He is alert and oriented to person, place, and time.  Psychiatric:        Mood and Affect: Mood normal.        Behavior: Behavior normal. Behavior is cooperative.        Thought Content: Thought content normal.  Judgment: Judgment normal.    Results for orders placed or performed in visit on 01/02/24  Microalbumin, Urine Waived   Collection Time: 01/02/24  8:20 AM  Result Value Ref Range   Microalb, Ur Waived 10 0 - 19 mg/L   Creatinine, Urine Waived 100 10 - 300 mg/dL   Microalb/Creat Ratio <30 <30 mg/g  Comprehensive metabolic panel   Collection Time: 01/02/24  8:21 AM  Result Value Ref Range   Glucose 109 (H) 70 - 99 mg/dL   BUN 14 6 - 24 mg/dL   Creatinine, Ser 1.47 0.76 - 1.27 mg/dL   eGFR 85 >82 NF/AOZ/3.08   BUN/Creatinine Ratio 14 9 - 20   Sodium 140 134 - 144 mmol/L   Potassium 4.1 3.5 - 5.2  mmol/L   Chloride 103 96 - 106 mmol/L   CO2 24 20 - 29 mmol/L   Calcium 9.6 8.7 - 10.2 mg/dL   Total Protein 6.2 6.0 - 8.5 g/dL   Albumin 4.2 3.8 - 4.9 g/dL   Globulin, Total 2.0 1.5 - 4.5 g/dL   Bilirubin Total 0.4 0.0 - 1.2 mg/dL   Alkaline Phosphatase 69 44 - 121 IU/L   AST 13 0 - 40 IU/L   ALT 14 0 - 44 IU/L  CBC with Differential/Platelet   Collection Time: 01/02/24  8:21 AM  Result Value Ref Range   WBC 7.2 3.4 - 10.8 x10E3/uL   RBC 4.55 4.14 - 5.80 x10E6/uL   Hemoglobin 14.5 13.0 - 17.7 g/dL   Hematocrit 65.7 84.6 - 51.0 %   MCV 94 79 - 97 fL   MCH 31.9 26.6 - 33.0 pg   MCHC 34.0 31.5 - 35.7 g/dL   RDW 96.2 95.2 - 84.1 %   Platelets 302 150 - 450 x10E3/uL   Neutrophils 58 Not Estab. %   Lymphs 30 Not Estab. %   Monocytes 7 Not Estab. %   Eos 4 Not Estab. %   Basos 1 Not Estab. %   Neutrophils Absolute 4.3 1.4 - 7.0 x10E3/uL   Lymphocytes Absolute 2.2 0.7 - 3.1 x10E3/uL   Monocytes Absolute 0.5 0.1 - 0.9 x10E3/uL   EOS (ABSOLUTE) 0.3 0.0 - 0.4 x10E3/uL   Basophils Absolute 0.1 0.0 - 0.2 x10E3/uL   Immature Granulocytes 0 Not Estab. %   Immature Grans (Abs) 0.0 0.0 - 0.1 x10E3/uL  Lipid Panel w/o Chol/HDL Ratio   Collection Time: 01/02/24  8:21 AM  Result Value Ref Range   Cholesterol, Total 125 100 - 199 mg/dL   Triglycerides 76 0 - 149 mg/dL   HDL 41 >32 mg/dL   VLDL Cholesterol Cal 15 5 - 40 mg/dL   LDL Chol Calc (NIH) 69 0 - 99 mg/dL  TSH   Collection Time: 01/02/24  8:21 AM  Result Value Ref Range   TSH 2.630 0.450 - 4.500 uIU/mL  HgB A1c   Collection Time: 01/02/24  8:21 AM  Result Value Ref Range   Hgb A1c MFr Bld 5.8 (H) 4.8 - 5.6 %   Est. average glucose Bld gHb Est-mCnc 120 mg/dL  Amylase   Collection Time: 01/02/24  8:21 AM  Result Value Ref Range   Amylase 35 31 - 110 U/L  Lipase   Collection Time: 01/02/24  8:21 AM  Result Value Ref Range   Lipase 51 13 - 78 U/L  PSA   Collection Time: 01/02/24  8:21 AM  Result Value Ref Range    Prostate Specific Ag, Serum 1.1 0.0 -  4.0 ng/mL      Assessment & Plan:   Problem List Items Addressed This Visit       Other   Erectile dysfunction - Primary   Ongoing for one year.  Has HTN but is controlled.  Will trial Viagra 50 to 100 MG as needed for ED.  Recommend if any adverse reactions such as chest pain, BP elevation, or rapid HR to stop taking and alert provider.  All questions answered.        Follow up plan: Return if symptoms worsen or fail to improve.

## 2024-02-17 NOTE — Patient Instructions (Signed)

## 2024-02-17 NOTE — Assessment & Plan Note (Signed)
 Ongoing for one year.  Has HTN but is controlled.  Will trial Viagra 50 to 100 MG as needed for ED.  Recommend if any adverse reactions such as chest pain, BP elevation, or rapid HR to stop taking and alert provider.  All questions answered.

## 2024-05-18 ENCOUNTER — Encounter: Payer: Self-pay | Admitting: Internal Medicine

## 2024-06-25 ENCOUNTER — Ambulatory Visit (AMBULATORY_SURGERY_CENTER)

## 2024-06-25 ENCOUNTER — Encounter: Payer: Self-pay | Admitting: Internal Medicine

## 2024-06-25 VITALS — Ht 70.0 in | Wt 190.0 lb

## 2024-06-25 DIAGNOSIS — Z1211 Encounter for screening for malignant neoplasm of colon: Secondary | ICD-10-CM

## 2024-06-25 MED ORDER — NA SULFATE-K SULFATE-MG SULF 17.5-3.13-1.6 GM/177ML PO SOLN: 1.0000 | Freq: Once | ORAL | 0 refills | Status: AC

## 2024-06-25 NOTE — Progress Notes (Signed)

## 2024-06-30 NOTE — Patient Instructions (Signed)
 Be Involved in Caring For Your Health:  Taking Medications When medications are taken as directed, they can greatly improve your health. But if they are not taken as prescribed, they may not work. In some cases, not taking them correctly can be harmful. To help ensure your treatment remains effective and safe, understand your medications and how to take them. Bring your medications to each visit for review by your provider.  Your lab results, notes, and after visit summary will be available on My Chart. We strongly encourage you to use this feature. If lab results are abnormal the clinic will contact you with the appropriate steps. If the clinic does not contact you assume the results are satisfactory. You can always view your results on My Chart. If you have questions regarding your health or results, please contact the clinic during office hours. You can also ask questions on My Chart.  We at [facility] are grateful that you chose us  to provide your care. We strive to provide evidence-based and compassionate care and are always looking for feedback. If you get a survey from the clinic please complete this so we can hear your opinions.  DASH Eating Plan DASH stands for Dietary Approaches to Stop Hypertension. The DASH eating plan is a healthy eating plan that has been shown to: Lower high blood pressure (hypertension). Reduce your risk for type 2 diabetes, heart disease, and stroke. Help with weight loss. What are tips for following this plan? Reading food labels Check food labels for the amount of salt (sodium) per serving. Choose foods with less than 5 percent of the Daily Value (DV) of sodium. In general, foods with less than 300 milligrams (mg) of sodium per serving fit into this eating plan. To find whole grains, look for the word whole as the first word in the ingredient list. Shopping Buy products labeled as low-sodium or no salt added. Buy fresh foods. Avoid canned  foods and pre-made or frozen meals. Cooking Try not to add salt when you cook. Use salt-free seasonings or herbs instead of table salt or sea salt. Check with your health care provider or pharmacist before using salt substitutes. Do not fry foods. Cook foods in healthy ways, such as baking, boiling, grilling, roasting, or broiling. Cook using oils that are good for your heart. These include olive, canola, avocado, soybean, and sunflower oil. Meal planning  Eat a balanced diet. This should include: 4 or more servings of fruits and 4 or more servings of vegetables each day. Try to fill half of your plate with fruits and vegetables. 6-8 servings of whole grains each day. 6 or less servings of lean meat, poultry, or fish each day. 1 oz is 1 serving. A 3 oz (85 g) serving of meat is about the same size as the palm of your hand. One egg is 1 oz (28 g). 2-3 servings of low-fat dairy each day. One serving is 1 cup (237 mL). 1 serving of nuts, seeds, or beans 5 times each week. 2-3 servings of heart-healthy fats. Healthy fats called omega-3 fatty acids are found in foods such as walnuts, flaxseeds, fortified milks, and eggs. These fats are also found in cold-water fish, such as sardines, salmon, and mackerel. Limit how much you eat of: Canned or prepackaged foods. Food that is high in trans fat, such as fried foods. Food that is high in saturated fat, such as fatty meat. Desserts and other sweets, sugary drinks, and other foods with added sugar. Full-fat  dairy products. Do not salt foods before eating. Do not eat more than 4 egg yolks a week. Try to eat at least 2 vegetarian meals a week. Eat more home-cooked food and less restaurant, buffet, and fast food. Lifestyle When eating at a restaurant, ask if your food can be made with less salt or no salt. If you drink alcohol: Limit how much you have to: 0-1 drink a day if you are male. 0-2 drinks a day if you are male. Know how much alcohol is in  your drink. In the [place] one drink is one 12 oz bottle of beer (355 mL), one 5 oz glass of wine (148 mL), or one 1 oz glass of hard liquor (44 mL). General information Avoid eating more than 2,300 mg of salt a day. If you have hypertension, you may need to reduce your sodium intake to 1,500 mg a day. Work with your provider to stay at a healthy body weight or lose weight. Ask what the best weight range is for you. On most days of the week, get at least 30 minutes of exercise that causes your heart to beat faster. This may include walking, swimming, or biking. Work with your provider or dietitian to adjust your eating plan to meet your specific calorie needs. What foods should I eat? Fruits All fresh, dried, or frozen fruit. Canned fruits that are in their natural juice and do not have sugar added to them. Vegetables Fresh or frozen vegetables that are raw, steamed, roasted, or grilled. Low-sodium or reduced-sodium tomato and vegetable juice. Low-sodium or reduced-sodium tomato sauce and tomato paste. Low-sodium or reduced-sodium canned vegetables. Grains Whole-grain or whole-wheat bread. Whole-grain or whole-wheat pasta. Brown rice. [place]. Bulgur. Whole-grain and low-sodium cereals. Pita bread. Low-fat, low-sodium crackers. Whole-wheat flour tortillas. Meats and other proteins Skinless chicken or [place]. Ground chicken or [place]. Pork with fat trimmed off. Fish and seafood. Egg whites. Dried beans, peas, or lentils. Unsalted nuts, nut butters, and seeds. Unsalted canned beans. Lean cuts of beef with fat trimmed off. Low-sodium, lean precooked or cured meat, such as sausages or meat loaves. Dairy Low-fat (1%) or fat-free (skim) milk. Reduced-fat, low-fat, or fat-free cheeses. Nonfat, low-sodium ricotta or cottage cheese. Low-fat or nonfat yogurt. Low-fat, low-sodium cheese. Fats and oils Soft margarine without trans fats. Vegetable oil. Reduced-fat, low-fat, or light mayonnaise and salad  dressings (reduced-sodium). Canola, safflower, olive, avocado, soybean, and sunflower oils. Avocado. Seasonings and condiments Herbs. Spices. Seasoning mixes without salt. Other foods Unsalted popcorn and pretzels. Fat-free sweets. The items listed above may not be all the foods and drinks you can have. Talk to a dietitian to learn more. What foods should I avoid? Fruits Canned fruit in a light or heavy syrup. Fried fruit. Fruit in cream or butter sauce. Vegetables Creamed or fried vegetables. Vegetables in a cheese sauce. Regular canned vegetables that are not marked as low-sodium or reduced-sodium. Regular canned tomato sauce and paste that are not marked as low-sodium or reduced-sodium. Regular tomato and vegetable juices that are not marked as low-sodium or reduced-sodium. Dene. Olives. Grains Baked goods made with fat, such as croissants, muffins, or some breads. Dry pasta or rice meal packs. Meats and other proteins Fatty cuts of meat. Ribs. Fried meat. [place]. Bologna, salami, and other precooked or cured meats, such as sausages or meat loaves, that are not lean and low in sodium. Fat from the back of a pig (fatback). Bratwurst. Salted nuts and seeds. Canned beans with added salt. Canned  or smoked fish. Whole eggs or egg yolks. Chicken or [place] with skin. Dairy Whole or 2% milk, cream, and half-and-half. Whole or full-fat cream cheese. Whole-fat or sweetened yogurt. Full-fat cheese. Nondairy creamers. Whipped toppings. Processed cheese and cheese spreads. Fats and oils Butter. Stick margarine. Lard. Shortening. Ghee. Bacon fat. Tropical oils, such as coconut, palm kernel, or palm oil. Seasonings and condiments Onion salt, garlic salt, seasoned salt, table salt, and sea salt. Worcestershire sauce. Tartar sauce. Barbecue sauce. Teriyaki sauce. Soy sauce, including reduced-sodium soy sauce. Steak sauce. Canned and packaged gravies. Fish sauce. Oyster sauce. Cocktail sauce. Store-bought  horseradish. Ketchup. Mustard. Meat flavorings and tenderizers. Bouillon cubes. Hot sauces. Pre-made or packaged marinades. Pre-made or packaged taco seasonings. Relishes. Regular salad dressings. Other foods Salted popcorn and pretzels. The items listed above may not be all the foods and drinks you should avoid. Talk to a dietitian to learn more. Where to find more information National Heart, Lung, and Blood Institute (NHLBI): BuffaloDryCleaner.gl American Heart Association (AHA): heart.org Academy of Nutrition and Dietetics: eatright.org National Kidney Foundation (NKF): kidney.org This information is not intended to replace advice given to you by your health care provider. Make sure you discuss any questions you have with your health care provider. Document Revised: 11/11/2022 Document Reviewed: 11/11/2022 Elsevier Patient Education  2024 [employer].

## 2024-07-02 ENCOUNTER — Ambulatory Visit (INDEPENDENT_AMBULATORY_CARE_PROVIDER_SITE_OTHER): Payer: 59 | Admitting: Nurse Practitioner

## 2024-07-02 VITALS — BP 128/80 | HR 81 | Temp 97.7°F | Ht 70.3 in | Wt 195.2 lb

## 2024-07-02 DIAGNOSIS — I10 Essential (primary) hypertension: Secondary | ICD-10-CM

## 2024-07-02 DIAGNOSIS — F419 Anxiety disorder, unspecified: Secondary | ICD-10-CM

## 2024-07-02 DIAGNOSIS — R7301 Impaired fasting glucose: Secondary | ICD-10-CM | POA: Diagnosis not present

## 2024-07-02 DIAGNOSIS — F1090 Alcohol use, unspecified, uncomplicated: Secondary | ICD-10-CM

## 2024-07-02 DIAGNOSIS — F17210 Nicotine dependence, cigarettes, uncomplicated: Secondary | ICD-10-CM

## 2024-07-02 DIAGNOSIS — E6609 Other obesity due to excess calories: Secondary | ICD-10-CM

## 2024-07-02 DIAGNOSIS — E7800 Pure hypercholesterolemia, unspecified: Secondary | ICD-10-CM | POA: Diagnosis not present

## 2024-07-02 NOTE — Assessment & Plan Note (Signed)
 Chronic, improving.  BP stable today.  Continue to recommend reduction of alcohol use as suspect this drives BP up.  Recommend he monitor BP at least a few mornings a week at home and document.  DASH diet at home.  Continue current medication regimen and adjust as needed, may need to increase Hydrochlorothiazide to 25 MG in future.  Labs: CMP.  Recommend complete cessation of smoking.

## 2024-07-02 NOTE — Assessment & Plan Note (Signed)
 Chronic, ongoing.  Continue current medication regimen and adjust as needed. Lipid panel today.

## 2024-07-02 NOTE — Progress Notes (Signed)
 BP 128/80 (BP Location: Left Arm)   Pulse 81   Temp 97.7 F (36.5 C) (Oral)   Ht 5' 10.3 (1.786 m)   Wt 195 lb 3.2 oz (88.5 kg)   SpO2 100%   BMI 27.77 kg/m    Subjective:    Patient ID: Kevin Buckley, male    DOB: [birth year], 58 y.o.   MRN: [id number]  HPI: [name] is a 58 y.o. male  Chief Complaint  Patient presents with   Depression   Hyperlipidemia   Hypertension   HYPERTENSION & HLD Continues to take Losartan -HCTZ 100-12.5 MG daily and Amlodipine  10 MG daily.  Atorvastatin  and Lofibra for HLD.    Drinks only on the weekends, about 6-7 beers.  Smoker, smokes 1 to 1 1/2 PPD, has been smoking since mid-20's on and off.  Does not wish to obtain lung cancer screening at this time. Family history of pancreatic cancer. Hypertension status: stable  Satisfied with current treatment? yes Duration of hypertension: chronic BP monitoring frequency:  none BP range:  BP medication side effects:  no Medication compliance: good compliance Aspirin: no Recurrent headaches: no Visual changes: no Palpitations: no Dyspnea: no Chest pain: no Lower extremity edema: no Dizzy/lightheaded: no  The ASCVD Risk score (Arnett DK, et al., 2019) failed to calculate for the following reasons:   The valid total cholesterol range is 130 to 320 mg/dL   Impaired Fasting Glucose HbA1C:  Lab Results  Component Value Date   HGBA1C 5.8 (H) 01/02/2024  Duration of elevated blood sugar: chronic Polydipsia: no Polyuria: no Weight change: no Visual disturbance: no Glucose Monitoring: no    Accucheck frequency: Not Checking    Fasting glucose:     Post prandial:  Diabetic Education: Not Completed Family history of diabetes: yes   DEPRESSION Takes Zoloft . Currently marital life is improving. Mood status: stable Satisfied with current treatment?: yes Symptom severity: moderate  Duration of current treatment : chronic Side effects: no Medication compliance: good  compliance Psychotherapy/counseling: no Depressed mood: no Anxious mood: no Anhedonia: no Significant weight loss or gain: no Insomnia: no Fatigue: no Feelings of worthlessness or guilt: yes Impaired concentration/indecisiveness: no Suicidal ideations: no Hopelessness: no Crying spells: no    07/02/2024    8:26 AM 01/02/2024    8:05 AM 06/27/2023   10:56 AM 01/31/2023    2:28 PM 01/31/2023    2:17 PM  Depression screen PHQ 2/9  Decreased Interest 0 0 0 0 0  Down, Depressed, Hopeless 0 1 0 0 0  PHQ - 2 Score 0 1 0 0 0  Altered sleeping 0 0 0 0 0  Tired, decreased energy 0 0 0 0 0  Change in appetite 0 1 0 0 0  Feeling bad or failure about yourself  0 1 0 0 0  Trouble concentrating 0 0 0 0 0  Moving slowly or fidgety/restless 0 0 0 0 0  Suicidal thoughts 0 0 0 0 0  PHQ-9 Score 0 3 0 0 0  Difficult doing work/chores Not difficult at all Somewhat difficult Not difficult at all Not difficult at all Not difficult at all       07/02/2024    8:26 AM 01/02/2024    8:05 AM 06/27/2023   10:56 AM 01/31/2023    2:28 PM  GAD 7 : Generalized Anxiety Score  Nervous, Anxious, on Edge 1 0 1 0  Control/stop worrying 0 1 0 0  Worry too much - different things  1 1 0 0  Trouble relaxing 0 0 0 0  Restless 0 0 0 0  Easily annoyed or irritable 0 1 0 0  Afraid - awful might happen 0 0 0 0  Total GAD 7 Score 2 3 1  0  Anxiety Difficulty Not difficult at all Somewhat difficult Not difficult at all Not difficult at all   Relevant past medical, surgical, family and social history reviewed and updated as indicated. Interim medical history since our last visit reviewed. Allergies and medications reviewed and updated.  Review of Systems  Constitutional:  Negative for activity change, diaphoresis, fatigue and fever.  Respiratory:  Negative for cough, chest tightness, shortness of breath and wheezing.   Cardiovascular:  Negative for chest pain, palpitations and leg swelling.  Gastrointestinal: Negative.    Endocrine: Negative for cold intolerance, heat intolerance, polydipsia, polyphagia and polyuria.  Neurological:  Negative for dizziness, syncope, weakness, light-headedness, numbness and headaches.  Psychiatric/Behavioral: Negative.      Per HPI unless specifically indicated above     Objective:    BP 128/80 (BP Location: Left Arm)   Pulse 81   Temp 97.7 F (36.5 C) (Oral)   Ht 5' 10.3 (1.786 m)   Wt 195 lb 3.2 oz (88.5 kg)   SpO2 100%   BMI 27.77 kg/m   Wt Readings from Last 3 Encounters:  07/02/24 195 lb 3.2 oz (88.5 kg)  06/25/24 190 lb (86.2 kg)  01/02/24 196 lb (88.9 kg)    Physical Exam Vitals and nursing note reviewed.  Constitutional:      General: He is awake. He is not in acute distress.    Appearance: He is well-developed and well-groomed. He is not ill-appearing or toxic-appearing.  HENT:     Head: Normocephalic.     Right Ear: Hearing and external ear normal.     Left Ear: Hearing and external ear normal.  Eyes:     General: Lids are normal.     Extraocular Movements: Extraocular movements intact.     Conjunctiva/sclera: Conjunctivae normal.  Neck:     Thyroid: No thyromegaly.     Vascular: No carotid bruit.  Cardiovascular:     Rate and Rhythm: Normal rate and regular rhythm.     Heart sounds: Normal heart sounds. No murmur heard.    No gallop.  Pulmonary:     Effort: No accessory muscle usage or respiratory distress.     Breath sounds: Wheezing present. No decreased breath sounds or rales.     Comments: A few scattered expiratory wheezes. Abdominal:     General: Bowel sounds are normal. There is no distension.     Palpations: Abdomen is soft.     Tenderness: There is no abdominal tenderness.  Musculoskeletal:     Cervical back: Full passive range of motion without pain.     Right lower leg: No edema.     Left lower leg: No edema.  Lymphadenopathy:     Cervical: No cervical adenopathy.  Skin:    General: Skin is warm.     Capillary Refill:  Capillary refill takes less than 2 seconds.  Neurological:     Mental Status: He is alert and oriented to person, place, and time.     Deep Tendon Reflexes: Reflexes are normal and symmetric.     Reflex Scores:      Brachioradialis reflexes are 2+ on the right side and 2+ on the left side.      Patellar reflexes are 2+ on  the right side and 2+ on the left side. Psychiatric:        Attention and Perception: Attention normal.        Mood and Affect: Mood normal.        Speech: Speech normal.        Behavior: Behavior normal. Behavior is cooperative.        Thought Content: Thought content normal.    Results for orders placed or performed in visit on 01/02/24  Microalbumin, Urine Waived   Collection Time: 01/02/24  8:20 AM  Result Value Ref Range   Microalb, Ur Waived 10 0 - 19 mg/L   Creatinine, Urine Waived 100 10 - 300 mg/dL   Microalb/Creat Ratio <30 <30 mg/g  Comprehensive metabolic panel   Collection Time: 01/02/24  8:21 AM  Result Value Ref Range   Glucose 109 (H) 70 - 99 mg/dL   BUN 14 6 - 24 mg/dL   Creatinine, Ser 8.96 0.76 - 1.27 mg/dL   eGFR 85 >40 fO/fpw/8.26   BUN/Creatinine Ratio 14 9 - 20   Sodium 140 134 - 144 mmol/L   Potassium 4.1 3.5 - 5.2 mmol/L   Chloride 103 96 - 106 mmol/L   CO2 24 20 - 29 mmol/L   Calcium  9.6 8.7 - 10.2 mg/dL   Total Protein 6.2 6.0 - 8.5 g/dL   Albumin 4.2 3.8 - 4.9 g/dL   Globulin, Total 2.0 1.5 - 4.5 g/dL   Bilirubin Total 0.4 0.0 - 1.2 mg/dL   Alkaline Phosphatase 69 44 - 121 IU/L   AST 13 0 - 40 IU/L   ALT 14 0 - 44 IU/L  CBC with Differential/Platelet   Collection Time: 01/02/24  8:21 AM  Result Value Ref Range   WBC 7.2 3.4 - 10.8 x10E3/uL   RBC 4.55 4.14 - 5.80 x10E6/uL   Hemoglobin 14.5 13.0 - 17.7 g/dL   Hematocrit 57.3 62.4 - 51.0 %   MCV 94 79 - 97 fL   MCH 31.9 26.6 - 33.0 pg   MCHC 34.0 31.5 - 35.7 g/dL   RDW 87.7 88.3 - 84.5 %   Platelets 302 150 - 450 x10E3/uL   Neutrophils 58 Not Estab. %   Lymphs 30 Not  Estab. %   Monocytes 7 Not Estab. %   Eos 4 Not Estab. %   Basos 1 Not Estab. %   Neutrophils Absolute 4.3 1.4 - 7.0 x10E3/uL   Lymphocytes Absolute 2.2 0.7 - 3.1 x10E3/uL   Monocytes Absolute 0.5 0.1 - 0.9 x10E3/uL   EOS (ABSOLUTE) 0.3 0.0 - 0.4 x10E3/uL   Basophils Absolute 0.1 0.0 - 0.2 x10E3/uL   Immature Granulocytes 0 Not Estab. %   Immature Grans (Abs) 0.0 0.0 - 0.1 x10E3/uL  Lipid Panel w/o Chol/HDL Ratio   Collection Time: 01/02/24  8:21 AM  Result Value Ref Range   Cholesterol, Total 125 100 - 199 mg/dL   Triglycerides 76 0 - 149 mg/dL   HDL 41 >60 mg/dL   VLDL Cholesterol Cal 15 5 - 40 mg/dL   LDL Chol Calc (NIH) 69 0 - 99 mg/dL  TSH   Collection Time: 01/02/24  8:21 AM  Result Value Ref Range   TSH 2.630 0.450 - 4.500 uIU/mL  HgB A1c   Collection Time: 01/02/24  8:21 AM  Result Value Ref Range   Hgb A1c MFr Bld 5.8 (H) 4.8 - 5.6 %   Est. average glucose Bld gHb Est-mCnc 120 mg/dL  Amylase   Collection  Time: 01/02/24  8:21 AM  Result Value Ref Range   Amylase 35 31 - 110 U/L  Lipase   Collection Time: 01/02/24  8:21 AM  Result Value Ref Range   Lipase 51 13 - 78 U/L  PSA   Collection Time: 01/02/24  8:21 AM  Result Value Ref Range   Prostate Specific Ag, Serum 1.1 0.0 - 4.0 ng/mL      Assessment & Plan:   Problem List Items Addressed This Visit       Cardiovascular and Mediastinum   Benign essential hypertension - Primary   Chronic, improving.  BP stable today.  Continue to recommend reduction of alcohol use as suspect this drives BP up.  Recommend he monitor BP at least a few mornings a week at home and document.  DASH diet at home.  Continue current medication regimen and adjust as needed, may need to increase Hydrochlorothiazide to 25 MG in future.  Labs: CMP.  Recommend complete cessation of smoking.         Relevant Orders   Comprehensive metabolic panel with GFR     Endocrine   IFG (impaired fasting glucose)   A1c 5.8% last check, discussed  with patient.  Stable.  Recheck annually.        Other   Pure hypercholesterolemia   Chronic, ongoing.  Continue current medication regimen and adjust as needed.  Lipid panel today.         Relevant Orders   Lipid Panel w/o Chol/HDL Ratio   Nicotine dependence, cigarettes, uncomplicated   I have recommended complete cessation of tobacco use. I have discussed various options available for assistance with tobacco cessation including over the counter methods (Nicotine gum, patch and lozenges). We also discussed prescription options (Chantix, Nicotine Inhaler / Nasal Spray). The patient is not interested in pursuing any prescription tobacco cessation options at this time.  Refuses lung cancer screening, highly recommended he obtain.        Anxiety   Chronic, stable with medication.  Denies SI/HI.  At this time continue current medication regimen and adjust as needed.  Refills as needed.      Alcohol use   Reports drinking only on weekends.  Drinks 5-6 beers on the weekend.  Recommend continue to work towards cessation of alcohol use or reduced use as currently doing.  He is aware of effect on blood pressure and overall health.        Follow up plan: Return in about 6 months (around 01/02/2025) for Annual Physical after 01/01/25.

## 2024-07-02 NOTE — Assessment & Plan Note (Signed)
 Reports drinking only on weekends.  Drinks 5-6 beers on the weekend.  Recommend continue to work towards cessation of alcohol use or reduced use as currently doing.  He is aware of effect on blood pressure and overall health.

## 2024-07-02 NOTE — Assessment & Plan Note (Signed)
 A1c 5.8% last check, discussed with patient.  Stable.  Recheck annually.

## 2024-07-02 NOTE — Assessment & Plan Note (Signed)
I have recommended complete cessation of tobacco use. I have discussed various options available for assistance with tobacco cessation including over the counter methods (Nicotine gum, patch and lozenges). We also discussed prescription options (Chantix, Nicotine Inhaler / Nasal Spray). The patient is not interested in pursuing any prescription tobacco cessation options at this time.  Refuses lung cancer screening, highly recommended he obtain.   

## 2024-07-02 NOTE — Assessment & Plan Note (Signed)
 Chronic, stable with medication.  Denies SI/HI.  At this time continue current medication regimen and adjust as needed.  Refills as needed.

## 2024-07-03 ENCOUNTER — Ambulatory Visit: Payer: Self-pay | Admitting: Nurse Practitioner

## 2024-07-03 LAB — COMPREHENSIVE METABOLIC PANEL WITH GFR
ALT: 16 IU/L (ref 0–44)
AST: 14 IU/L (ref 0–40)
Albumin: 4.2 g/dL (ref 3.8–4.9)
Alkaline Phosphatase: 85 IU/L (ref 44–121)
BUN/Creatinine Ratio: 12 (ref 9–20)
BUN: 13 mg/dL (ref 6–24)
Bilirubin Total: 0.4 mg/dL (ref 0.0–1.2)
CO2: 22 mmol/L (ref 20–29)
Calcium: 9.2 mg/dL (ref 8.7–10.2)
Chloride: 105 mmol/L (ref 96–106)
Creatinine, Ser: 1.1 mg/dL (ref 0.76–1.27)
Globulin, Total: 1.9 g/dL (ref 1.5–4.5)
Glucose: 95 mg/dL (ref 70–99)
Potassium: 4.1 mmol/L (ref 3.5–5.2)
Sodium: 141 mmol/L (ref 134–144)
Total Protein: 6.1 g/dL (ref 6.0–8.5)
eGFR: 78 mL/min/1.73 (ref >59)

## 2024-07-03 LAB — LIPID PANEL W/O CHOL/HDL RATIO
Cholesterol, Total: 134 mg/dL (ref 100–199)
HDL: 47 mg/dL (ref >39)
LDL Chol Calc (NIH): 74 mg/dL (ref 0–99)
Triglycerides: 62 mg/dL (ref 0–149)
VLDL Cholesterol Cal: 13 mg/dL (ref 5–40)

## 2024-07-03 NOTE — Progress Notes (Signed)
 Contacted via MyChart  Good morning Plato, your labs have returned and overall look great!!  No changes needed.  Any questions? Keep being amazing!!  Thank you for allowing me to participate in your care.  I appreciate you. Kindest regards, Taraoluwa Thakur

## 2024-07-11 ENCOUNTER — Ambulatory Visit: Admitting: Internal Medicine

## 2024-07-11 ENCOUNTER — Encounter: Payer: Self-pay | Admitting: Internal Medicine

## 2024-07-11 VITALS — BP 164/99 | HR 61 | Temp 98.0°F | Resp 12 | Ht 70.0 in | Wt 190.0 lb

## 2024-07-11 DIAGNOSIS — Z860101 Personal history of adenomatous and serrated colon polyps: Secondary | ICD-10-CM | POA: Diagnosis not present

## 2024-07-11 DIAGNOSIS — K5730 Diverticulosis of large intestine without perforation or abscess without bleeding: Secondary | ICD-10-CM | POA: Diagnosis not present

## 2024-07-11 DIAGNOSIS — K635 Polyp of colon: Secondary | ICD-10-CM

## 2024-07-11 DIAGNOSIS — Z1211 Encounter for screening for malignant neoplasm of colon: Secondary | ICD-10-CM | POA: Diagnosis not present

## 2024-07-11 DIAGNOSIS — Z860100 Personal history of colon polyps, unspecified: Secondary | ICD-10-CM

## 2024-07-11 DIAGNOSIS — D122 Benign neoplasm of ascending colon: Secondary | ICD-10-CM

## 2024-07-11 DIAGNOSIS — K6389 Other specified diseases of intestine: Secondary | ICD-10-CM | POA: Diagnosis not present

## 2024-07-11 LAB — COLONOSCOPY

## 2024-07-11 MED ORDER — SODIUM CHLORIDE 0.9 % IV SOLN: 500.0000 mL | INTRAVENOUS | Status: DC

## 2024-07-11 NOTE — Patient Instructions (Signed)
 Educational handout provided to patient related to Polyps, and Diverticulosis  Resume previous diet  Continue present medications  Awaiting pathology results   YOU HAD AN ENDOSCOPIC PROCEDURE TODAY AT THE [facility]:   Refer to the procedure report that was given to you for any specific questions about what was found during the examination.  If the procedure report does not answer your questions, please call your gastroenterologist to clarify.  If you requested that your care partner not be given the details of your procedure findings, then the procedure report has been included in a sealed envelope for you to review at your convenience later.  YOU SHOULD EXPECT: Some feelings of bloating in the abdomen. Passage of more gas than usual.  Walking can help get rid of the air that was put into your GI tract during the procedure and reduce the bloating. If you had a lower endoscopy (such as a colonoscopy or flexible sigmoidoscopy) you may notice spotting of blood in your stool or on the toilet paper. If you underwent a bowel prep for your procedure, you may not have a normal bowel movement for a few days.  Please Note:  You might notice some irritation and congestion in your nose or some drainage.  This is from the oxygen used during your procedure.  There is no need for concern and it should clear up in a day or so.  SYMPTOMS TO REPORT IMMEDIATELY:  Following lower endoscopy (colonoscopy or flexible sigmoidoscopy):  Excessive amounts of blood in the stool  Significant tenderness or worsening of abdominal pains  Swelling of the abdomen that is new, acute  Fever of 100F or higher  For urgent or emergent issues, a gastroenterologist can be reached at any hour by calling [phone]. Do not use MyChart messaging for urgent concerns.    DIET:  We do recommend a small meal at first, but then you may proceed to your regular diet.  Drink plenty of fluids but you should avoid  alcoholic beverages for 24 hours.  ACTIVITY:  You should plan to take it easy for the rest of today and you should NOT DRIVE or use heavy machinery until tomorrow (because of the sedation medicines used during the test).    FOLLOW UP: Our staff will call the number listed on your records the next business day following your procedure.  We will call around 7:15- 8:00 am to check on you and address any questions or concerns that you may have regarding the information given to you following your procedure. If we do not reach you, we will leave a message.     If any biopsies were taken you will be contacted by phone or by letter within the next 1-3 weeks.  Please call us at [phone] if you have not heard about the biopsies in 3 weeks.    SIGNATURES/CONFIDENTIALITY: You and/or your care partner have signed paperwork which will be entered into your electronic medical record.  These signatures attest to the fact that that the information above on your After Visit Summary has been reviewed and is understood.  Full responsibility of the confidentiality of this discharge information lies with you and/or your care-partner.

## 2024-07-11 NOTE — Progress Notes (Signed)
 HISTORY OF PRESENT ILLNESS:  Kevin Buckley is a 58 y.o. male sent for surveillance colonoscopy.  Previous examination with Dr. Luis May 23, 2017 revealed multiple advanced adenomatous colon polyps.  Overdue for follow-up.  No complaints  REVIEW OF SYSTEMS:  All non-GI ROS negative except for  Past Medical History:  Diagnosis Date   Anxiety    Elevated cholesterol    Hypertension     History reviewed. No pertinent surgical history.  Social History Kevin Buckley  reports that he has been smoking cigarettes. He has a 5 pack-year smoking history. He has never used smokeless tobacco. He reports current alcohol use of about 12.0 standard drinks of alcohol per week. He reports that he does not use drugs.  family history includes Anxiety disorder in his maternal grandmother; Hypertension in his paternal grandmother; Multiple sclerosis in his mother; Pancreatic cancer in his father.  No Known Allergies     PHYSICAL EXAMINATION: Vital signs: BP (!) 144/85   Pulse 71   Temp 98 F (36.7 C)   Ht 5' 10 (1.778 m)   Wt 190 lb (86.2 kg)   SpO2 98%   BMI 27.26 kg/m  General: Well-developed, well-nourished, no acute distress HEENT: Sclerae are anicteric, conjunctiva pink. Oral mucosa intact Lungs: Clear Heart: Regular Abdomen: soft, nontender, nondistended, no obvious ascites, no peritoneal signs, normal bowel sounds. No organomegaly. Extremities: No edema Psychiatric: alert and oriented x3. Cooperative     ASSESSMENT:  History of multiple advanced colon polyps   PLAN:  Surveillance colonoscopy

## 2024-07-11 NOTE — Progress Notes (Signed)
 Sedate, gd SR, tolerated procedure well, VSS, report to RN

## 2024-07-11 NOTE — Progress Notes (Signed)
 Pt's states no medical or surgical changes since previsit or office visit.

## 2024-07-11 NOTE — Op Note (Signed)
 [facility] Patient Name: Kevin Buckley Procedure Date: 07/11/2024 10:37 AM MRN: [id number] Endoscopist: Norleen SAILOR. Abran , MD, [id number] Age: 58 Referring MD:  Date of Birth: May 08, 1966 Gender: Male Account #: [id number] Procedure:                Colonoscopy with cold snare polypectomy x 1 Indications:              High risk colon cancer surveillance: Personal                            history of adenoma (10 mm or greater in size), High                            risk colon cancer surveillance: Personal history of                            multiple (3 or more) adenomas. Previous examination                            with Dr. Luis 2018. Overdue for follow-up Medicines:                Monitored Anesthesia Care Procedure:                Pre-Anesthesia Assessment:                           - Prior to the procedure, a History and Physical                            was performed, and patient medications and                            allergies were reviewed. The patient's tolerance of                            previous anesthesia was also reviewed. The risks                            and benefits of the procedure and the sedation                            options and risks were discussed with the patient.                            All questions were answered, and informed consent                            was obtained. Prior Anticoagulants: The patient has                            taken no anticoagulant or antiplatelet agents. ASA                            Grade Assessment: II - A patient with mild systemic  disease. After reviewing the risks and benefits,                            the patient was deemed in satisfactory condition to                            undergo the procedure.                           After obtaining informed consent, the colonoscope                            was passed under direct vision. Throughout the                             procedure, the patient's blood pressure, pulse, and                            oxygen saturations were monitored continuously. The                            Olympus Scope [id number] was introduced through the                            anus and advanced to the the cecum, identified by                            appendiceal orifice and ileocecal valve. The                            ileocecal valve, appendiceal orifice, and rectum                            were photographed. The quality of the bowel                            preparation was excellent. The colonoscopy was                            performed without difficulty. The patient tolerated                            the procedure well. The bowel preparation used was                            SUPREP via split dose instruction. Scope In: 10:56:52 AM Scope Out: 11:09:32 AM Scope Withdrawal Time: 0 hours 10 minutes 10 seconds  Total Procedure Duration: 0 hours 12 minutes 40 seconds  Findings:                 A 4 mm polyp was found in the ascending colon. The                            polyp was removed with a cold snare. Resection  and                            retrieval were complete.                           Many diverticula were found in the sigmoid colon.                            These were associated with deep folds.                           The exam was otherwise without abnormality on                            direct and retroflexion views. Complications:            No immediate complications. Estimated blood loss:                            None. Estimated Blood Loss:     Estimated blood loss: none. Impression:               - One 4 mm polyp in the ascending colon, removed                            with a cold snare. Resected and retrieved.                           - Diverticulosis in the sigmoid colon.                           - The examination was otherwise normal on direct                             and retroflexion views. Recommendation:           - Repeat colonoscopy in 5 years for surveillance.                           - Patient has a contact number available for                            emergencies. The signs and symptoms of potential                            delayed complications were discussed with the                            patient. Return to normal activities tomorrow.                            Written discharge instructions were provided to the                            patient.                           -  Resume previous diet.                           - Continue present medications.                           - Await pathology results. Norleen SAILOR. Abran, MD 07/11/2024 11:15:54 AM This report has been signed electronically.

## 2024-07-11 NOTE — Progress Notes (Signed)
 Called to room to assist during endoscopic procedure.  Patient ID and intended procedure confirmed with present staff. Received instructions for my participation in the procedure from the performing physician.

## 2024-07-12 ENCOUNTER — Telehealth: Payer: Self-pay

## 2024-07-12 NOTE — Telephone Encounter (Signed)
 Post procedure follow up call, no answer

## 2024-07-13 LAB — SURGICAL PATHOLOGY

## 2024-07-17 ENCOUNTER — Ambulatory Visit: Payer: Self-pay | Admitting: Internal Medicine

## 2024-11-22 ENCOUNTER — Encounter: Payer: Self-pay | Admitting: Nurse Practitioner

## 2025-01-14 ENCOUNTER — Encounter: Admitting: Nurse Practitioner

## 2025-01-22 ENCOUNTER — Encounter: Admitting: Nurse Practitioner
